# Patient Record
Sex: Male | Born: 1980 | Race: Black or African American | Hispanic: No | Marital: Married | Smoking: Former smoker
Health system: Southern US, Community
[De-identification: ages and names within clinical notes are randomized; demographics above are authoritative.]

## PROBLEM LIST (undated history)

## (undated) DIAGNOSIS — E119 Type 2 diabetes mellitus without complications: Secondary | ICD-10-CM

## (undated) DIAGNOSIS — R519 Headache, unspecified: Secondary | ICD-10-CM

## (undated) DIAGNOSIS — K219 Gastro-esophageal reflux disease without esophagitis: Secondary | ICD-10-CM

## (undated) DIAGNOSIS — F419 Anxiety disorder, unspecified: Secondary | ICD-10-CM

## (undated) DIAGNOSIS — R51 Headache: Secondary | ICD-10-CM

## (undated) DIAGNOSIS — M1990 Unspecified osteoarthritis, unspecified site: Secondary | ICD-10-CM

## (undated) DIAGNOSIS — B019 Varicella without complication: Secondary | ICD-10-CM

## (undated) HISTORY — DX: Gastro-esophageal reflux disease without esophagitis: K21.9

## (undated) HISTORY — DX: Headache, unspecified: R51.9

## (undated) HISTORY — DX: Unspecified osteoarthritis, unspecified site: M19.90

## (undated) HISTORY — PX: TONSILLECTOMY AND ADENOIDECTOMY: SUR1326

## (undated) HISTORY — DX: Anxiety disorder, unspecified: F41.9

## (undated) HISTORY — DX: Varicella without complication: B01.9

## (undated) HISTORY — DX: Headache: R51

## (undated) HISTORY — DX: Type 2 diabetes mellitus without complications: E11.9

## (undated) HISTORY — PX: UPPER GI ENDOSCOPY: SHX6162

---

## 2006-03-17 ENCOUNTER — Other Ambulatory Visit: Payer: Self-pay

## 2006-03-17 ENCOUNTER — Emergency Department: Payer: Self-pay | Admitting: Emergency Medicine

## 2007-01-02 ENCOUNTER — Emergency Department: Payer: Self-pay | Admitting: Emergency Medicine

## 2008-03-09 ENCOUNTER — Emergency Department: Payer: Self-pay

## 2008-09-30 ENCOUNTER — Emergency Department: Payer: Self-pay | Admitting: Emergency Medicine

## 2009-10-24 ENCOUNTER — Emergency Department: Payer: Self-pay | Admitting: Emergency Medicine

## 2011-01-31 ENCOUNTER — Emergency Department: Payer: Self-pay | Admitting: Emergency Medicine

## 2011-01-31 LAB — COMPREHENSIVE METABOLIC PANEL WITH GFR
Albumin: 4.4 g/dL
Alkaline Phosphatase: 40 U/L — ABNORMAL LOW
Anion Gap: 8
BUN: 9 mg/dL
Bilirubin,Total: 0.6 mg/dL
Calcium, Total: 9 mg/dL
Chloride: 102 mmol/L
Co2: 31 mmol/L
Creatinine: 0.66 mg/dL
EGFR (African American): >60
EGFR (Non-African Amer.): >60
Glucose: 95 mg/dL
Osmolality: 280
Potassium: 3.4 mmol/L — ABNORMAL LOW
SGOT(AST): 36 U/L
SGPT (ALT): 61 U/L
Sodium: 141 mmol/L
Total Protein: 7.9 g/dL

## 2011-01-31 LAB — CBC
HCT: 38.6 % — ABNORMAL LOW (ref 40.0–52.0)
MCH: 26.9 pg (ref 26.0–34.0)
MCHC: 32.3 g/dL (ref 32.0–36.0)
MCV: 83 fL (ref 80–100)
Platelet: 221 10*3/uL (ref 150–440)
RBC: 4.63 10*6/uL (ref 4.40–5.90)

## 2011-01-31 LAB — TROPONIN I: Troponin-I: <0.02 ng/mL

## 2011-06-02 ENCOUNTER — Ambulatory Visit: Payer: Self-pay | Admitting: Family Medicine

## 2014-10-27 ENCOUNTER — Encounter: Payer: Self-pay | Admitting: Family Medicine

## 2014-10-27 ENCOUNTER — Telehealth: Payer: Self-pay | Admitting: Family Medicine

## 2014-10-27 ENCOUNTER — Ambulatory Visit (INDEPENDENT_AMBULATORY_CARE_PROVIDER_SITE_OTHER): Payer: BLUE CROSS/BLUE SHIELD | Admitting: Family Medicine

## 2014-10-27 VITALS — BP 124/76 | HR 59 | Temp 98.3°F | Ht 65.16 in | Wt 187.4 lb

## 2014-10-27 DIAGNOSIS — R109 Unspecified abdominal pain: Secondary | ICD-10-CM | POA: Insufficient documentation

## 2014-10-27 DIAGNOSIS — R200 Anesthesia of skin: Secondary | ICD-10-CM | POA: Diagnosis not present

## 2014-10-27 DIAGNOSIS — R079 Chest pain, unspecified: Secondary | ICD-10-CM

## 2014-10-27 DIAGNOSIS — R1084 Generalized abdominal pain: Secondary | ICD-10-CM

## 2014-10-27 DIAGNOSIS — G44209 Tension-type headache, unspecified, not intractable: Secondary | ICD-10-CM

## 2014-10-27 DIAGNOSIS — H539 Unspecified visual disturbance: Secondary | ICD-10-CM

## 2014-10-27 DIAGNOSIS — R519 Headache, unspecified: Secondary | ICD-10-CM | POA: Insufficient documentation

## 2014-10-27 DIAGNOSIS — R51 Headache: Secondary | ICD-10-CM

## 2014-10-27 DIAGNOSIS — M545 Low back pain, unspecified: Secondary | ICD-10-CM

## 2014-10-27 LAB — CBC
HCT: 37.9 % — ABNORMAL LOW (ref 39.0–52.0)
Hemoglobin: 12.3 g/dL — ABNORMAL LOW (ref 13.0–17.0)
MCHC: 32.5 g/dL (ref 30.0–36.0)
MCV: 81.4 fl (ref 78.0–100.0)
Platelets: 172 10*3/uL (ref 150.0–400.0)
RBC: 4.66 Mil/uL (ref 4.22–5.81)
RDW: 14.3 % (ref 11.5–15.5)
WBC: 3.4 10*3/uL — ABNORMAL LOW (ref 4.0–10.5)

## 2014-10-27 LAB — LIPID PANEL
CHOLESTEROL: 132 mg/dL (ref 0–200)
HDL: 29.7 mg/dL — ABNORMAL LOW (ref >39.00)
LDL Cholesterol: 88 mg/dL (ref 0–99)
NonHDL: 102.59
Total CHOL/HDL Ratio: 4
Triglycerides: 74 mg/dL (ref 0.0–149.0)
VLDL: 14.8 mg/dL (ref 0.0–40.0)

## 2014-10-27 LAB — COMPREHENSIVE METABOLIC PANEL
ALBUMIN: 4.2 g/dL (ref 3.5–5.2)
ALK PHOS: 34 U/L — AB (ref 39–117)
ALT: 24 U/L (ref 0–53)
AST: 21 U/L (ref 0–37)
BUN: 8 mg/dL (ref 6–23)
CHLORIDE: 104 meq/L (ref 96–112)
CO2: 31 mEq/L (ref 19–32)
CREATININE: 0.7 mg/dL (ref 0.40–1.50)
Calcium: 8.8 mg/dL (ref 8.4–10.5)
GFR: 164.24 mL/min (ref >60.00)
Glucose, Bld: 95 mg/dL (ref 70–99)
Potassium: 4.2 mEq/L (ref 3.5–5.1)
SODIUM: 142 meq/L (ref 135–145)
TOTAL PROTEIN: 6.9 g/dL (ref 6.0–8.3)
Total Bilirubin: 0.4 mg/dL (ref 0.2–1.2)

## 2014-10-27 LAB — HEMOGLOBIN A1C: HEMOGLOBIN A1C: 6.3 % (ref 4.6–6.5)

## 2014-10-27 LAB — TSH: TSH: 0.67 u[IU]/mL (ref 0.35–4.50)

## 2014-10-27 MED ORDER — OMEPRAZOLE 20 MG PO CPDR: 20.0000 mg | DELAYED_RELEASE_CAPSULE | Freq: Every day | ORAL | Status: DC

## 2014-10-27 NOTE — Assessment & Plan Note (Signed)
Seems to be tension type headaches given description. Patient has other symptoms with numbness and vision changes that do not sound as though they are associated with the headaches in particular, though one would wonder if there is some unifying cause for this. He is neurologically intact. Vision is similar in both eyes with slight difference in the right eye. No complaints of these issues today. Given constellation of symptoms will obtain MRI brain to evaluate further. Will check lumbar spine and cervical spine films to evaluate bony structures given locations of numbness. Will refer to optho and neuro for evaluation. Continue advil for tension headaches as this has proven beneficial. Given return precautions.

## 2014-10-27 NOTE — Assessment & Plan Note (Addendum)
Patient with chest pain for a number of years. Seems to be associated with the time period after eating, thus GERD is most likely cause, though would not expect to have dyspnea with GERD thus cardiac cause is possible. EKG reassuring today as it is similar to previous EKG. Unlikely VTE given normal O2 sat and not tachycardic. Normal lung exam makes pulmonary process unlikely. Will start on PPI given GERD is the most likely cause. Will refer to cardiology for further evaluation. Check TSH, CBC, cmet, lipid panel. Given return precautions.

## 2014-10-27 NOTE — Assessment & Plan Note (Signed)
Very nonspecific needle sensation in abdomen diffusely. Benign exam today. Has history of alcohol use so could be liver dysfunction. No urinary complaints. Could be related to diarrhea. Will check CMET today. Will continue to monitor. Given return precautions.

## 2014-10-27 NOTE — Progress Notes (Signed)
Pre visit review using our clinic review tool, if applicable. No additional management support is needed unless otherwise documented below in the visit note. 

## 2014-10-27 NOTE — Telephone Encounter (Signed)
Attempted to call patient to discuss lab results. There was no answer. Asked that he call back to the office. Will await his call.

## 2014-10-27 NOTE — Progress Notes (Signed)
Patient ID: Ryan Wolfe, male   DOB: [birth year], 34 y.o.   MRN: [id number]  Tommi Rumps, MD Phone: [phone]  Ryan Wolfe is a 34 y.o. male who presents today for new patient visit.  Chest pain: notes this occurs mostly at night. Has history of GERD. Notes it is a heavy sensation and is associated with dyspnea. No radiation or diaphoresis. Is central. Has had intermittently for multiple years. No history of DM, HLD, or HTN. No cardaic history in patient. No family history of MI. Eases off on its own. Lasts for one hour at a time. Is not exertional. No history of VTE. No leg swelling. No chest pain or shortness of breath at this time.   Headache: patient notes intermittent frontal headaches that are gradual onset for the past year. Occur most days. Are throbbing. Notes advil resolves the headaches. Notes intermittent left arm and bilateral leg numbness not associated with the headaches. Notes vision intermittently gets milky, though is still able to see. No eye pain or redness with this. No neck pain or photophobia or phonophobia. Notes no symptoms at this time.  Abdominal pain: notes intermittent diffuse sensation of needles in his abdomen. No focal pain. No aching pain. Notes some diarrhea each morning though otherwise normal stools. No N/V. No blood in stool. Has had blood in stool in past about 5 years ago in [place] and had colonoscopy at that time. States he was told this was related to his alcohol use. Notes now only drinks on the weekends a few beers at a time. This discomfort goes away on its own. Had prescription for reflux that helped with this. No pain at this time.   Low back pain: intermittent for the past year. Notes exacerbated if he bends or extends his back in an odd manner. Notes intermittent numbness in his legs that is not noted to occur with the back pain. The numbness does not occur in both legs at the same time. No weakness. No fever, saddle anesthesia, incontinence, or  history of cancer. Mild discomfort at this time in low back.  Active Ambulatory Problems    Diagnosis Date Noted  . Chest pain 10/27/2014  . Abdominal pain 10/27/2014  . Headache 10/27/2014  . Low back pain 10/27/2014   Resolved Ambulatory Problems    Diagnosis Date Noted  . No Resolved Ambulatory Problems   Past Medical History  Diagnosis Date  . Arthritis   . Chicken pox   . GERD (gastroesophageal reflux disease)     Family History  Problem Relation Age of Onset  . Arthritis      grandparent  . Hypertension      parent and grandparent  . Diabetes Mother     Social History   Social History  . Marital Status: Married    Spouse Name: N/A  . Number of Children: N/A  . Years of Education: N/A   Occupational History  . Not on file.   Social History Main Topics  . Smoking status: Current Some Day Smoker  . Smokeless tobacco: Not on file  . Alcohol Use: 3.0 oz/week    5 Standard drinks or equivalent per week  . Drug Use: No  . Sexual Activity: Not on file   Other Topics Concern  . Not on file   Social History Narrative  . No narrative on file    ROS   General:  Negative for nexplained weight loss, fever Skin: Negative for new or changing mole, sore that won't  heal HEENT: positive for vision changes, hoarseness, Negative for trouble hearing, ringing in ears, mouth sores, change in voice, dysphagia. CV:  Positive for chest pain and shortness of breath, Negative for edema, palpitations Resp: Negative for cough, dyspnea, hemoptysis GI: positive for abdominal pain and diarrhea, Negative for nausea, vomiting, constipation, melena, hematochezia. GU: Negative for dysuria, incontinence, urinary hesitance, hematuria, vaginal or penile discharge, polyuria, sexual difficulty, lumps in testicle or breasts MSK: Negative for muscle cramps or aches, joint pain or swelling Neuro: positive for headaches, weakness, numbness, Negative for dizziness, passing out/fainting Psych:  Negative for depression, anxiety, memory problems  Objective  Physical Exam Filed Vitals:   10/27/14 0844  BP: 124/76  Pulse: 59  Temp: 98.3 F (36.8 C)   Physical Exam  Constitutional: He is well-developed, well-nourished, and in no distress.  HENT:  Head: Normocephalic and atraumatic.  Right Ear: External ear normal.  Left Ear: External ear normal.  Mouth/Throat: Oropharynx is clear and moist. No oropharyngeal exudate.  Normal TM bilaterally  Eyes: Conjunctivae are normal. Pupils are equal, round, and reactive to light.  Normal appearing cornea and sclera on inspection, attempted fundus exam, pupillary constriction on exam made difficult to appreciate the fundus  Neck: Neck supple.  Cardiovascular: Normal rate, regular rhythm and normal heart sounds.  Exam reveals no gallop and no friction rub.   No murmur heard. Pulmonary/Chest: Effort normal and breath sounds normal. No respiratory distress. He has no wheezes. He has no rales.  Abdominal: Soft. Bowel sounds are normal. He exhibits no distension and no mass. There is no tenderness. There is no rebound and no guarding.  Musculoskeletal: He exhibits no edema.  Lymphadenopathy:    He has no cervical adenopathy.  Neurological: He is alert.  CN 2-12 intact, 5/5 strength in bilateral biceps, triceps, grip, quads, hamstrings, plantar and dorsiflexion, sensation to light touch intact in bilateral UE and LE, normal gait, 2+ patellar reflexes  Skin: Skin is warm and dry. He is not diaphoretic.  Psychiatric: Mood and affect normal.   EKG: sinus bradycardia with rate variation, rate 54, non-specific T-wave change in V3, rate slower than prior EKG otherwise no significant changes  Assessment/Plan:   Chest pain Patient with chest pain for a number of years. Seems to be associated with the time period after eating, thus GERD is most likely cause, though would not expect to have dyspnea with GERD thus cardiac cause is possible. EKG  reassuring today as it is similar to previous EKG. Unlikely VTE given normal O2 sat and not tachycardic. Normal lung exam makes pulmonary process unlikely. Will start on PPI given GERD is the most likely cause. Will refer to cardiology for further evaluation. Check TSH, CBC, cmet, lipid panel. Given return precautions.   Abdominal pain Very nonspecific needle sensation in abdomen diffusely. Benign exam today. Has history of alcohol use so could be liver dysfunction. No urinary complaints. Could be related to diarrhea. Will check CMET today. Will continue to monitor. Given return precautions.   Headache Seems to be tension type headaches given description. Patient has other symptoms with numbness and vision changes that do not sound as though they are associated with the headaches in particular, though one would wonder if there is some unifying cause for this. He is neurologically intact. Vision is similar in both eyes with slight difference in the right eye. No complaints of these issues today. Given constellation of symptoms will obtain MRI brain to evaluate further. Will check lumbar spine and cervical  spine films to evaluate bony structures given locations of numbness. Will refer to optho and neuro for evaluation. Continue advil for tension headaches as this has proven beneficial. Given return precautions.   Low back pain Chronic issue over the past year. Some numbness, though unsure if this is associated with the back pain. Neuro intact. No red flags. Benign exam today. Will check lumbar spine XR. Advil OTC prn. Given return precautions.     Orders Placed This Encounter  Procedures  . DG Lumbar Spine Complete    Standing Status: Future     Number of Occurrences:      Standing Expiration Date: 12/27/2015    Order Specific Question:  Reason for Exam (SYMPTOM  OR DIAGNOSIS REQUIRED)    Answer:  low back pain    Order Specific Question:  Preferred imaging location?    Answer:  [place] Cervical Spine Complete    Standing Status: Future     Number of Occurrences:      Standing Expiration Date: 12/27/2015    Order Specific Question:  Reason for Exam (SYMPTOM  OR DIAGNOSIS REQUIRED)    Answer:  left arm numbness intermittently    Order Specific Question:  Preferred imaging location?    Answer:  [facility]  . MR Brain Wo Contrast    Standing Status: Future     Number of Occurrences:      Standing Expiration Date: 12/27/2015    Order Specific Question:  Reason for Exam (SYMPTOM  OR DIAGNOSIS REQUIRED)    Answer:  left arm numbness intermittently    Order Specific Question:  Preferred imaging location?    Answer:  [facility]    Order Specific Question:  Does the patient have a pacemaker or implanted devices?    Answer:  No    Order Specific Question:  What is the patient's sedation requirement?    Answer:  No Sedation  . CBC  . TSH  . Lipid Profile  . Comp Met (CMET)  . HgB A1c  . Ambulatory referral to Cardiology    Referral Priority:  Routine    Referral Type:  Consultation    Referral Reason:  Specialty Services Required    Requested Specialty:  Cardiology    Number of Visits Requested:  1  . Ambulatory referral to Neurology    Referral Priority:  Routine    Referral Type:  Consultation    Referral Reason:  Specialty Services Required    Requested Specialty:  Neurology    Number of Visits Requested:  1  . Ambulatory referral to Ophthalmology    Referral Priority:  Routine    Referral Type:  Consultation    Referral Reason:  Specialty Services Required    Requested Specialty:  Ophthalmology    Number of Visits Requested:  1  . EKG 12-Lead    Meds ordered this encounter  Medications  . omeprazole (PRILOSEC) 20 MG capsule    Sig: Take 1 capsule (20 mg total) by mouth daily.    Dispense:  30 capsule    Refill:  [place]

## 2014-10-27 NOTE — Assessment & Plan Note (Signed)
Chronic issue over the past year. Some numbness, though unsure if this is associated with the back pain. Neuro intact. No red flags. Benign exam today. Will check lumbar spine XR. Advil OTC prn. Given return precautions.

## 2014-10-27 NOTE — Patient Instructions (Signed)
Nice to meet you. We will obtain imaging and lab work to evaluate your issues further.  If you develop numbness, weakness, incontinence, fever, abdominal pain, nausea, vomiting, diarrhea, chest pain, shortness of breath, headache, vision changes, worsening pain, or new or a change in symptoms.

## 2014-10-30 ENCOUNTER — Ambulatory Visit (INDEPENDENT_AMBULATORY_CARE_PROVIDER_SITE_OTHER)
Admission: RE | Admit: 2014-10-30 | Discharge: 2014-10-30 | Disposition: A | Discharge status: Home / Self Care | Payer: BLUE CROSS/BLUE SHIELD | Source: Ambulatory Visit | Attending: Family Medicine | Admitting: Family Medicine

## 2014-10-30 ENCOUNTER — Encounter: Payer: Self-pay | Admitting: Family Medicine

## 2014-10-30 DIAGNOSIS — R200 Anesthesia of skin: Secondary | ICD-10-CM | POA: Diagnosis not present

## 2014-10-30 DIAGNOSIS — M545 Low back pain, unspecified: Secondary | ICD-10-CM

## 2014-10-30 NOTE — Telephone Encounter (Signed)
Letter mailed

## 2014-10-30 NOTE — Telephone Encounter (Signed)
Attempted to call patient again. No answer and left voicemail asking him to call back to the office.   Given 2 phone calls to patient will send letter with results asking patient to call back to the office to discuss the results.

## 2014-11-02 ENCOUNTER — Telehealth: Payer: Self-pay | Admitting: *Deleted

## 2014-11-02 NOTE — Telephone Encounter (Signed)
Patient has requested a a follow up on his lab results, a detail message can left on phone number provided.

## 2014-11-03 NOTE — Telephone Encounter (Signed)
Called patient back. There was no answer. Left a detailed message on his voicemail discussing his results. Advised of x-ray results with no bony abnormalities and with apparent spasm in his neck. Advised of CBC abnormalities and that he needs to come in to have these repeated. Advised of other acceptable results. Prior note states he is having severe neck and back pain and given this I advised in my message that he be evaluated over the weekend for this at an urgent care or [facility]. Advised he could call back with any questions.

## 2014-11-03 NOTE — Telephone Encounter (Signed)
Patient called a second time requesting results on lab and Xray. Patient stated that he has to work today at 1430, and a detail ,message can be left on voicemail. Patient has a preference to talk to someone, if possible. Patient stated that he's having severe neck and back pain.

## 2014-11-03 NOTE — Telephone Encounter (Signed)
Patient wanting lab results. 

## 2014-11-06 ENCOUNTER — Telehealth: Payer: Self-pay | Admitting: *Deleted

## 2014-11-06 NOTE — Telephone Encounter (Signed)
Patient called in reference to a voicemail, left by Dr. Birdie Sons, patient wanted to know if he should have a lab visit or a office visit. Patient stated that he did not go to urgent care over the weekend.

## 2014-11-07 NOTE — Telephone Encounter (Signed)
Noted. Thanks.

## 2014-11-07 NOTE — Telephone Encounter (Signed)
Patient coming in for visit on 11/08/14

## 2014-11-08 ENCOUNTER — Ambulatory Visit (INDEPENDENT_AMBULATORY_CARE_PROVIDER_SITE_OTHER): Payer: BLUE CROSS/BLUE SHIELD | Admitting: Family Medicine

## 2014-11-08 ENCOUNTER — Encounter: Payer: Self-pay | Admitting: Family Medicine

## 2014-11-08 VITALS — BP 112/66 | HR 68 | Temp 98.6°F | Ht 65.16 in | Wt 186.6 lb

## 2014-11-08 DIAGNOSIS — D72819 Decreased white blood cell count, unspecified: Secondary | ICD-10-CM | POA: Diagnosis not present

## 2014-11-08 DIAGNOSIS — M545 Low back pain, unspecified: Secondary | ICD-10-CM

## 2014-11-08 DIAGNOSIS — E119 Type 2 diabetes mellitus without complications: Secondary | ICD-10-CM | POA: Insufficient documentation

## 2014-11-08 DIAGNOSIS — G44209 Tension-type headache, unspecified, not intractable: Secondary | ICD-10-CM

## 2014-11-08 DIAGNOSIS — M542 Cervicalgia: Secondary | ICD-10-CM

## 2014-11-08 DIAGNOSIS — R7303 Prediabetes: Secondary | ICD-10-CM

## 2014-11-08 LAB — CBC WITH DIFFERENTIAL/PLATELET
BASOS ABS: 0 10*3/uL (ref 0.0–0.1)
Basophils Relative: 0.2 % (ref 0.0–3.0)
Eosinophils Absolute: 0.3 10*3/uL (ref 0.0–0.7)
Eosinophils Relative: 4.5 % (ref 0.0–5.0)
HCT: 37.8 % — ABNORMAL LOW (ref 39.0–52.0)
Hemoglobin: 12.5 g/dL — ABNORMAL LOW (ref 13.0–17.0)
LYMPHS ABS: 3.2 10*3/uL (ref 0.7–4.0)
LYMPHS PCT: 54.1 % — AB (ref 12.0–46.0)
MCHC: 33 g/dL (ref 30.0–36.0)
MCV: 79.6 fl (ref 78.0–100.0)
MONOS PCT: 10.5 % (ref 3.0–12.0)
Monocytes Absolute: 0.6 10*3/uL (ref 0.1–1.0)
NEUTROS PCT: 30.7 % — AB (ref 43.0–77.0)
Neutro Abs: 1.8 10*3/uL (ref 1.4–7.7)
Platelets: 213 10*3/uL (ref 150.0–400.0)
RBC: 4.75 Mil/uL (ref 4.22–5.81)
RDW: 14 % (ref 11.5–15.5)
WBC: 5.9 10*3/uL (ref 4.0–10.5)

## 2014-11-08 MED ORDER — CYCLOBENZAPRINE HCL 10 MG PO TABS: 10.0000 mg | ORAL_TABLET | Freq: Three times a day (TID) | ORAL | Status: DC | PRN

## 2014-11-08 NOTE — Progress Notes (Signed)
Pre visit review using our clinic review tool, if applicable. No additional management support is needed unless otherwise documented below in the visit note. 

## 2014-11-08 NOTE — Assessment & Plan Note (Signed)
Pain consistent with right trapezius spasm, likely contributing to headache last week. Improved with muscle relaxer. Spasm noted on cervical spine XR. Feels much improved with no headache or neck pain at this time. Neuro intact. Will treat with flexeril prn. Given return precautions.

## 2014-11-08 NOTE — Assessment & Plan Note (Signed)
Chronic issue with acute exacerbation last week. Reassuring lumbar films. Neuro intact. Pain improved today. Benign exam. Will give flexeril for prn use as this proved beneficial. Given return precautions.

## 2014-11-08 NOTE — Progress Notes (Signed)
Patient ID: Ryan Wolfe, male   DOB: 31-Aug-1980, 34 y.o.   MRN: [id number]  [name], MD Phone: [phone]  Ryan Wolfe is a 34 y.o. male who presents today for f/u.  Headache: had headache last week. Notes it was frontal in nature and aching and started after he developed pain and spasm in his right trapezius. Notes he had full ROM of neck. These were gradual onset symptoms. Massaging his neck helped. He took one of his brothers muscle relaxers and this resolved the discomfort. Has also been taking advil as needed. No numbness, weakness, or vision changes with this. No headache or neck pain at this time. He has a history of intermittent scattered numbness, though has not had any since last seeing me. He has a neurology appointment scheduled for later this month.   Low back pain: bilateral. Twinge and sharp pain with bending. Sitting up straight hurts as well. No specific injury, though does do a lot of lifting at work. Lumbar spine XR with no acute changes. No numbness, weakness, fevers, incontinence, saddle anesthesia, or history of cancer. Notes the muscle relaxer helped significantly. Minimal low back pain at this time.   Pre-diabetes: A1c of 6.3 at last visit. Notes some polyuria with this. No polydipsia. No other urinary symptoms. Mom has DM. Has been changing his diet. Decreased meat intake. Has stopped eating bread recently. No more soda intake. Eating salads at night, though is using a fair amount of dressing.   PMH: smoker.    ROS see HPI  Objective  Physical Exam Filed Vitals:   11/08/14 0817  BP: 112/66  Pulse: 68  Temp: 98.6 F (37 C)    Physical Exam  Constitutional: He is well-developed, well-nourished, and in no distress.  HENT:  Head: Normocephalic and atraumatic.  Right Ear: External ear normal.  Left Ear: External ear normal.  Mouth/Throat: Oropharynx is clear and moist. No oropharyngeal exudate.  Eyes: Conjunctivae are normal. Pupils are equal, round,  and reactive to light.  Neck: Normal range of motion. Neck supple.  Cardiovascular: Normal rate, regular rhythm and normal heart sounds.  Exam reveals no gallop and no friction rub.   No murmur heard. Pulmonary/Chest: Effort normal and breath sounds normal. No respiratory distress. He has no wheezes. He has no rales.  Musculoskeletal:  No midline spine tenderness, no muscular tenderness or spasm in his neck, there is no tenderness in muscular portion of his low back, no swelling in back   Lymphadenopathy:    He has no cervical adenopathy.  Neurological: He is alert.  CN 2-12 intact, 5/5 strength in bilateral biceps, triceps, grip, quads, hamstrings, plantar and dorsiflexion, sensation to light touch intact in bilateral UE and LE, normal gait, 2+ patellar reflexes  Skin: Skin is warm and dry. He is not diaphoretic.     Assessment/Plan: Please see individual problem list.  Headache Stable from last visit. No HA at this time. Neuro intact. Could be tension headaches given history of spasm in neck. Will trial flexeril for this and continue prn advil use. Patient will keep appointment with neurology for evaluation of headaches and scattered intermittent numbness. Attempted to order MRI at last visit of brain, though patients insurance company would not approve this for headache or numbness diagnoses stating patient needed 4 weeks of treatment by a physician. Will have neurology evaluate the patient and determine the need for imaging. Given return precautions.   Low back pain Chronic issue with acute exacerbation last week. Reassuring lumbar films.  Neuro intact. Pain improved today. Benign exam. Will give flexeril for prn use as this proved beneficial. Given return precautions.   Neck pain Pain consistent with right trapezius spasm, likely contributing to headache last week. Improved with muscle relaxer. Spasm noted on cervical spine XR. Feels much improved with no headache or neck pain at this  time. Neuro intact. Will treat with flexeril prn. Given return precautions.   Prediabetes A1c 6.3 at last visit. Discussed this with patient. Advised on diet. Advised to await cardiology appointment on Friday (reports no persistent chest pain symptoms) prior to increasing exercise. Discussed medication, though patient opted for diet changes. Will continue to monitor. Plan for A1c in 3 months.     Orders Placed This Encounter  Procedures  . CBC w/Diff    Meds ordered this encounter  Medications  . cyclobenzaprine (FLEXERIL) 10 MG tablet    Sig: Take 1 tablet (10 mg total) by mouth 3 (three) times daily as needed for muscle spasms.    Dispense:  30 tablet    Refill:  0   Marikay AlarEric Vallie Fayette

## 2014-11-08 NOTE — Patient Instructions (Signed)
Nice to see you. Your neck pain and back pain are likely due to muscle strain or spasm. We will treat this with a flexeril, a muscle relaxer.  Please work on dietary changes to help with your pre-diabetes. Please do not increase your activity level until you see the cardiologist. Please keep the appointment with the neurologist as well.  If you develop headache, numbness, weakness, vision changes, nausea, vomiting, fevers, back pain, loss of bowel or bladder function, or numbness between your legs please seek medical attention.

## 2014-11-08 NOTE — Assessment & Plan Note (Signed)
A1c 6.3 at last visit. Discussed this with patient. Advised on diet. Advised to await cardiology appointment on Friday (reports no persistent chest pain symptoms) prior to increasing exercise. Discussed medication, though patient opted for diet changes. Will continue to monitor. Plan for A1c in 3 months.

## 2014-11-08 NOTE — Assessment & Plan Note (Addendum)
Stable from last visit. No HA at this time. Neuro intact. Could be tension headaches given history of spasm in neck. Will trial flexeril for this and continue prn advil use. Patient will keep appointment with neurology for evaluation of headaches and scattered intermittent numbness. Attempted to order MRI at last visit of brain, though patients insurance company would not approve this for headache or numbness diagnoses stating patient needed 4 weeks of treatment by a physician. Will have neurology evaluate the patient and determine the need for imaging. Given return precautions.

## 2014-11-10 ENCOUNTER — Encounter: Payer: Self-pay | Admitting: Family Medicine

## 2014-11-15 ENCOUNTER — Encounter: Payer: Self-pay | Admitting: Surgical

## 2014-11-16 LAB — HM DIABETES EYE EXAM

## 2014-11-17 ENCOUNTER — Encounter: Payer: Self-pay | Admitting: Surgical

## 2014-11-28 ENCOUNTER — Other Ambulatory Visit: Payer: BLUE CROSS/BLUE SHIELD

## 2014-11-30 ENCOUNTER — Other Ambulatory Visit (INDEPENDENT_AMBULATORY_CARE_PROVIDER_SITE_OTHER): Payer: BLUE CROSS/BLUE SHIELD

## 2014-11-30 ENCOUNTER — Telehealth: Payer: Self-pay | Admitting: *Deleted

## 2014-11-30 DIAGNOSIS — D649 Anemia, unspecified: Secondary | ICD-10-CM

## 2014-11-30 LAB — FERRITIN: Ferritin: 186.5 ng/mL (ref 22.0–322.0)

## 2014-11-30 NOTE — Telephone Encounter (Signed)
Orders placed.

## 2014-11-30 NOTE — Telephone Encounter (Signed)
Labs and dx?  

## 2014-12-01 LAB — IRON AND TIBC
%SAT: 29 % (ref 15–60)
Iron: 74 ug/dL (ref 50–180)
TIBC: 255 ug/dL (ref 250–425)
UIBC: 181 ug/dL (ref 125–400)

## 2014-12-01 LAB — PATHOLOGIST SMEAR REVIEW

## 2014-12-07 ENCOUNTER — Encounter: Payer: Self-pay | Admitting: Family Medicine

## 2015-08-28 ENCOUNTER — Encounter: Payer: Self-pay | Admitting: Emergency Medicine

## 2015-08-28 ENCOUNTER — Emergency Department
Admission: EM | Admit: 2015-08-28 | Discharge: 2015-08-28 | Disposition: A | Discharge status: Home / Self Care | Payer: BLUE CROSS/BLUE SHIELD | Attending: Emergency Medicine | Admitting: Emergency Medicine

## 2015-08-28 DIAGNOSIS — F17200 Nicotine dependence, unspecified, uncomplicated: Secondary | ICD-10-CM | POA: Diagnosis not present

## 2015-08-28 DIAGNOSIS — L0100 Impetigo, unspecified: Secondary | ICD-10-CM | POA: Diagnosis not present

## 2015-08-28 DIAGNOSIS — R21 Rash and other nonspecific skin eruption: Secondary | ICD-10-CM | POA: Diagnosis present

## 2015-08-28 MED ORDER — AMOXICILLIN-POT CLAVULANATE 875-125 MG PO TABS
1.0000 | ORAL_TABLET | Freq: Once | ORAL | Status: AC
  Administered 2015-08-28: 1 via ORAL

## 2015-08-28 MED ORDER — VALACYCLOVIR HCL 500 MG PO TABS
ORAL_TABLET | ORAL | Status: AC
  Administered 2015-08-28: 1000 mg via ORAL
  Filled 2015-08-28: qty 2

## 2015-08-28 MED ORDER — VALACYCLOVIR HCL 500 MG PO TABS
1000.0000 mg | ORAL_TABLET | Freq: Once | ORAL | Status: AC
  Administered 2015-08-28: 1000 mg via ORAL

## 2015-08-28 MED ORDER — AMOXICILLIN-POT CLAVULANATE 875-125 MG PO TABS
ORAL_TABLET | ORAL | Status: AC
  Administered 2015-08-28: 1 via ORAL
  Filled 2015-08-28: qty 1

## 2015-08-28 MED ORDER — AMOXICILLIN-POT CLAVULANATE 875-125 MG PO TABS: 1.0000 | ORAL_TABLET | Freq: Two times a day (BID) | ORAL | 0 refills | Status: AC

## 2015-08-28 NOTE — ED Provider Notes (Signed)
[facility] Emergency Department Provider Note  ____________________________________________   None    (approximate)  I have reviewed the triage vital signs and the nursing notes.   HISTORY  Chief Complaint Rash   HPI Ryan Wolfe is a 35 y.o. male presents with rash to pruritic and subsequent painful left axilla 4 days. Patient states symptoms worsen after applying cortisone cream to the area. Patient denies any fever afebrile on presentation with temperature 90.8   Past Medical History:  Diagnosis Date  . Arthritis   . Chicken pox   . GERD (gastroesophageal reflux disease)   . Headache     Patient Active Problem List   Diagnosis Date Noted  . Neck pain 11/08/2014  . Prediabetes 11/08/2014  . Chest pain 10/27/2014  . Abdominal pain 10/27/2014  . Headache 10/27/2014  . Low back pain 10/27/2014    Past Surgical History:  Procedure Laterality Date  . TONSILLECTOMY AND ADENOIDECTOMY      Prior to Admission medications   Medication Sig Start Date End Date Taking? Authorizing Provider  amoxicillin-clavulanate (AUGMENTIN) 875-125 MG tablet Take 1 tablet by mouth 2 (two) times daily. 08/28/15 09/11/15  Darci Current, MD  cyclobenzaprine (FLEXERIL) 10 MG tablet Take 1 tablet (10 mg total) by mouth 3 (three) times daily as needed for muscle spasms. 11/08/14   Glori Luis, MD  omeprazole (PRILOSEC) 20 MG capsule Take 1 capsule (20 mg total) by mouth daily. 10/27/14   Glori Luis, MD    Allergies No known drug allergies  Family History  Problem Relation Age of Onset  . Arthritis      grandparent  . Hypertension      parent and grandparent  . Diabetes Mother     Social History Social History  Substance Use Topics  . Smoking status: Current Some Day Smoker  . Smokeless tobacco: Never Used  . Alcohol use 3.0 oz/week    5 Standard drinks or equivalent per week    Review of Systems Constitutional: No fever/chills Eyes: No  visual changes. ENT: No sore throat. Cardiovascular: Denies chest pain. Respiratory: Denies shortness of breath. Gastrointestinal: No abdominal pain.  No nausea, no vomiting.  No diarrhea.  No constipation. Genitourinary: Negative for dysuria. Musculoskeletal: Negative for back pain. Skin: Positive for rash. Neurological: Negative for headaches, focal weakness or numbness.  10-point ROS otherwise negative.  ____________________________________________   PHYSICAL EXAM:  VITAL SIGNS: ED Triage Vitals [08/28/15 0045]  Enc Vitals Group     BP 138/89     Pulse Rate (!) 54     Resp 18     Temp 98 F (36.7 C)     Temp Source Oral     SpO2 100 %     Weight 188 lb (85.3 kg)     Height  (1.676 m)     Head Circumference      Peak Flow      Pain Score      Pain Loc      Pain Edu?      Excl. in GC?     Constitutional: Alert and oriented. Well appearing and in no acute distress. Eyes: Conjunctivae are normal. PERRL. EOMI. Head: Atraumatic. Ears:  Healthy appearing ear canals and TMs bilaterally Nose: No congestion/rhinnorhea. Mouth/Throat: Mucous membranes are moist.  Oropharynx non-erythematous. Neck: No stridor.  No meningeal signs.  Cardiovascular: Normal rate, regular rhythm. Good peripheral circulation. Grossly normal heart sounds.   Respiratory: Normal respiratory effort.  No retractions. Lungs CTAB. Gastrointestinal: Soft and nontender. No distention.  Genitourinary:  Musculoskeletal: No lower extremity tenderness nor edema. No gross deformities of extremities. Neurologic:  Normal speech and language. No gross focal neurologic deficits are appreciated.  Skin:  Erythematous rash plaque formation left axilla. Pustules also noted   ____________________________________________   LABS (all labs ordered are listed, but only abnormal results are displayed)  Labs Reviewed - No data to  display ____________________________________________    PROCEDURES  Procedure(s) performed:   Procedures   ____________________________________________   INITIAL IMPRESSION / ASSESSMENT AND PLAN / ED COURSE  Pertinent labs & imaging results that were available during my care of the patient were reviewed by me and considered in my medical decision making (see chart for details).  Rash consistent with possible impetigo as such Augmentin 875 mg given will be prescribed at home considered a possibility of shingles however rash is not following dermatomal distribution.  Clinical Course    ____________________________________________  FINAL CLINICAL IMPRESSION(S) / ED DIAGNOSES  Final diagnoses:  Impetigo     MEDICATIONS GIVEN DURING THIS VISIT:  Medications  valACYclovir (VALTREX) tablet 1,000 mg (not administered)  amoxicillin-clavulanate (AUGMENTIN) 875-125 MG per tablet 1 tablet (not administered)     NEW OUTPATIENT MEDICATIONS STARTED DURING THIS VISIT:  New Prescriptions   AMOXICILLIN-CLAVULANATE (AUGMENTIN) 875-125 MG TABLET    Take 1 tablet by mouth 2 (two) times daily.      Note:  This document was prepared using Dragon voice recognition software and may include unintentional dictation errors.    Darci Current, MD 08/28/15 [phone]

## 2015-08-28 NOTE — ED Triage Notes (Signed)
Patient ambulatory to triage with steady gait, without difficulty or distress noted; pt reports rash to left axillae since Friday; st began as a bump and spread; area noted to axillae with few scattered vesicles down left side; pt reports has been using cortisone cream; Dr Manson Passey to triage to assess pt

## 2016-04-09 ENCOUNTER — Emergency Department
Admission: EM | Admit: 2016-04-09 | Discharge: 2016-04-09 | Disposition: A | Discharge status: Home / Self Care | Payer: BLUE CROSS/BLUE SHIELD | Attending: Emergency Medicine | Admitting: Emergency Medicine

## 2016-04-09 DIAGNOSIS — Z79899 Other long term (current) drug therapy: Secondary | ICD-10-CM | POA: Diagnosis not present

## 2016-04-09 DIAGNOSIS — J111 Influenza due to unidentified influenza virus with other respiratory manifestations: Secondary | ICD-10-CM

## 2016-04-09 DIAGNOSIS — B349 Viral infection, unspecified: Secondary | ICD-10-CM | POA: Diagnosis not present

## 2016-04-09 DIAGNOSIS — R0981 Nasal congestion: Secondary | ICD-10-CM | POA: Diagnosis present

## 2016-04-09 DIAGNOSIS — F17200 Nicotine dependence, unspecified, uncomplicated: Secondary | ICD-10-CM | POA: Insufficient documentation

## 2016-04-09 DIAGNOSIS — R69 Illness, unspecified: Secondary | ICD-10-CM

## 2016-04-09 MED ORDER — IBUPROFEN 600 MG PO TABS
600.0000 mg | ORAL_TABLET | Freq: Three times a day (TID) | ORAL | 0 refills | Status: DC | PRN
Start: 2016-04-09 — End: 2017-10-14

## 2016-04-09 MED ORDER — FIRST-DUKES MOUTHWASH MT SUSP: 10.0000 mL | Freq: Four times a day (QID) | OROMUCOSAL | 0 refills | Status: DC

## 2016-04-09 MED ORDER — LIDOCAINE VISCOUS 2 % MT SOLN: 5.0000 mL | Freq: Four times a day (QID) | OROMUCOSAL | 0 refills | Status: DC | PRN

## 2016-04-09 MED ORDER — PSEUDOEPH-BROMPHEN-DM 30-2-10 MG/5ML PO SYRP: 5.0000 mL | ORAL_SOLUTION | Freq: Four times a day (QID) | ORAL | 0 refills | Status: DC | PRN

## 2016-04-09 NOTE — ED Provider Notes (Signed)
[facility] Emergency Department Provider Note   ____________________________________________   First MD Initiated Contact with Patient 04/09/16 [phone]     (approximate)  I have reviewed the triage vital signs and the nursing notes.   HISTORY  Chief Complaint Influenza and Sore Throat    HPI Ryan Wolfe is a 36 y.o. male patient complaining of flulike symptoms consisting of nasal congestion intermittently runny nose. Patient also has sore throat, body aches, and nonproductive cough. Patient states transient relief with over-the-counter preparations. Patient denies nausea vomiting but had 1 episode of loose stools yesterday. Patient has not taken a flu shot this season.   Past Medical History:  Diagnosis Date  . Arthritis   . Chicken pox   . GERD (gastroesophageal reflux disease)   . Headache     Patient Active Problem List   Diagnosis Date Noted  . Neck pain 11/08/2014  . Prediabetes 11/08/2014  . Chest pain 10/27/2014  . Abdominal pain 10/27/2014  . Headache 10/27/2014  . Low back pain 10/27/2014    Past Surgical History:  Procedure Laterality Date  . TONSILLECTOMY AND ADENOIDECTOMY      Prior to Admission medications   Medication Sig Start Date End Date Taking? Authorizing Provider  brompheniramine-pseudoephedrine-DM 30-2-10 MG/5ML syrup Take 5 mLs by mouth 4 (four) times daily as needed. 04/09/16   Joni Reining, PA-C  cyclobenzaprine (FLEXERIL) 10 MG tablet Take 1 tablet (10 mg total) by mouth 3 (three) times daily as needed for muscle spasms. 11/08/14   Glori Luis, MD  Diphenhyd-Hydrocort-Nystatin (FIRST-DUKES MOUTHWASH) SUSP Use as directed 10 mLs in the mouth or throat 4 (four) times daily. 04/09/16   Joni Reining, PA-C  ibuprofen (ADVIL,MOTRIN) 600 MG tablet Take 1 tablet (600 mg total) by mouth every 8 (eight) hours as needed. 04/09/16   Joni Reining, PA-C  lidocaine (XYLOCAINE) 2 % solution Use as directed 5 mLs in the  mouth or throat every 6 (six) hours as needed for mouth pain. Mouthwashand swallow. 04/09/16   Joni Reining, PA-C  omeprazole (PRILOSEC) 20 MG capsule Take 1 capsule (20 mg total) by mouth daily. 10/27/14   Glori Luis, MD    Allergies Patient has no known allergies.  Family History  Problem Relation Age of Onset  . Arthritis      grandparent  . Hypertension      parent and grandparent  . Diabetes Mother     Social History Social History  Substance Use Topics  . Smoking status: Current Some Day Smoker  . Smokeless tobacco: Never Used  . Alcohol use 3.0 oz/week    5 Standard drinks or equivalent per week    Review of Systems Constitutional: No fever/chills. Body aches Eyes: No visual changes. ENT: Nasal congestion and sore throat Cardiovascular: Denies chest pain. Respiratory: Denies shortness of breath. Gastrointestinal: No abdominal pain.  No nausea, no vomiting.  No diarrhea.  No constipation. Genitourinary: Negative for dysuria. Musculoskeletal: Negative for back pain. Skin: Negative for rash. Neurological: Negative for headaches, focal weakness or numbness.    ____________________________________________   PHYSICAL EXAM:  VITAL SIGNS: ED Triage Vitals  Enc Vitals Group     BP 04/09/16 0835 137/86     Pulse Rate 04/09/16 0835 97     Resp 04/09/16 0835 18     Temp 04/09/16 0835 99.3 F (37.4 C)     Temp Source 04/09/16 0835 Oral     SpO2 04/09/16 0835 97 %  Weight 04/09/16 0835 182 lb (82.6 kg)     Height 04/09/16 0835 5\' 6"  (1.676 m)     Head Circumference --      Peak Flow --      Pain Score 04/09/16 0833 9     Pain Loc --      Pain Edu? --      Excl. in GC? --     Constitutional: Alert and oriented. Well appearing and in no acute distress. Eyes: Conjunctivae are normal. PERRL. EOMI. Head: Atraumatic. Nose:Edematous nasal turbinates clear rhinorrhea. Mouth/Throat: Mucous membranes are moist.  Oropharynx erythematous. Postnasal  drainage Neck: No stridor.  No cervical spine tenderness to palpation. Hematological/Lymphatic/Immunilogical: No cervical lymphadenopathy. Cardiovascular: Normal rate, regular rhythm. Grossly normal heart sounds.  Good peripheral circulation. Respiratory: Normal respiratory effort.  No retractions. Lungs CTAB. Nonproductive cough Gastrointestinal: Soft and nontender. No distention. No abdominal bruits. No CVA tenderness. Musculoskeletal: No lower extremity tenderness nor edema.  No joint effusions. Neurologic:  Normal speech and language. No gross focal neurologic deficits are appreciated. No gait instability. Skin:  Skin is warm, dry and intact. No rash noted. Psychiatric: Mood and affect are normal. Speech and behavior are normal.  ____________________________________________   LABS (all labs ordered are listed, but only abnormal results are displayed)  Labs Reviewed - No data to display ____________________________________________  EKG   ____________________________________________  RADIOLOGY   ____________________________________________   PROCEDURES  Procedure(s) performed: None  Procedures  Critical Care performed: No  ____________________________________________   INITIAL IMPRESSION / ASSESSMENT AND PLAN / ED COURSE  Pertinent labs & imaging results that were available during my care of the patient were reviewed by me and considered in my medical decision making (see chart for details).  Viral illness. Patient given discharge care instructions. Patient given a work note. Patient get a prescription for Bromfed DM, [facility] mouthwash, viscous lidocaine, and ibuprofen.      ____________________________________________   FINAL CLINICAL IMPRESSION(S) / ED DIAGNOSES  Final diagnoses:  Influenza-like illness      NEW MEDICATIONS STARTED DURING THIS VISIT:  New Prescriptions   BROMPHENIRAMINE-PSEUDOEPHEDRINE-DM 30-2-10 MG/5ML SYRUP    Take 5 mLs by mouth 4  (four) times daily as needed.   DIPHENHYD-HYDROCORT-NYSTATIN (FIRST-DUKES MOUTHWASH) SUSP    Use as directed 10 mLs in the mouth or throat 4 (four) times daily.   IBUPROFEN (ADVIL,MOTRIN) 600 MG TABLET    Take 1 tablet (600 mg total) by mouth every 8 (eight) hours as needed.   LIDOCAINE (XYLOCAINE) 2 % SOLUTION    Use as directed 5 mLs in the mouth or throat every 6 (six) hours as needed for mouth pain. Mouthwashand swallow.     Note:  This document was prepared using Dragon voice recognition software and may include unintentional dictation errors.    Joni ReiningRonald K Heinz Eckert, PA-C 04/09/16 [id number]    Jennye MoccasinBrian S Quigley, MD 04/09/16 [phone]

## 2016-04-09 NOTE — ED Notes (Signed)
Flu like sx's since Sunday, fever on Sunday.

## 2016-04-09 NOTE — ED Triage Notes (Signed)
Flu like sx. Has had fever on sunday

## 2016-12-22 ENCOUNTER — Ambulatory Visit: Payer: BLUE CROSS/BLUE SHIELD | Admitting: Internal Medicine

## 2016-12-22 ENCOUNTER — Encounter: Payer: Self-pay | Admitting: Internal Medicine

## 2016-12-22 DIAGNOSIS — K219 Gastro-esophageal reflux disease without esophagitis: Secondary | ICD-10-CM

## 2016-12-22 DIAGNOSIS — J329 Chronic sinusitis, unspecified: Secondary | ICD-10-CM

## 2016-12-22 MED ORDER — OMEPRAZOLE 20 MG PO CPDR: 20.0000 mg | DELAYED_RELEASE_CAPSULE | Freq: Every day | ORAL | 1 refills | Status: DC

## 2016-12-22 MED ORDER — AMOXICILLIN 875 MG PO TABS: 875.0000 mg | ORAL_TABLET | Freq: Two times a day (BID) | ORAL | 0 refills | Status: DC

## 2016-12-22 NOTE — Patient Instructions (Signed)
Saline nasal spray - flush nose at least 2-3x/day  nasacort nasal spray - 2 sprays each nostril one time per day.  Do this in the evening.    Take a probiotic daily while you are on the antibiotics and for two weeks after completing the antibiotics.    Examples of probiotics:  Florastor, culturelle or align  Robitussin twice a day as needed.

## 2016-12-22 NOTE — Progress Notes (Signed)
Patient ID: Ryan PeacockRobert Wolfe, male   DOB: 05/23/1980, 36 y.o.   MRN: [id number]   Subjective:    Patient ID: Ryan Peacockobert Wolfe, male    DOB: 05/23/1980, 36 y.o.   MRN: [id number]  HPI  Patient here as a work in with concerns regarding some increased nasal congestion and sinus pressure as well as increased acid reflux.  Has a history of acid reflux.  Started noticing increased problems recently.  Not taking omeprazole.  Started taking his mother's medication.  Has helped some.  Trying to watch what he eats.  No vomiting.  Also reports increased nasal congestion and sinus pressure.  Has been present for a while, but recently has progressed.  Now with increased pressure and green mucus production.  Increased drainage.  No sore throat.  No chest tightness or increased cough.  No diarrhea.  States has had sinus infections previously and this feels similar to previous infections.     Past Medical History:  Diagnosis Date  . Arthritis   . Chicken pox   . GERD (gastroesophageal reflux disease)   . Headache    Past Surgical History:  Procedure Laterality Date  . TONSILLECTOMY AND ADENOIDECTOMY     Family History  Problem Relation Age of Onset  . Arthritis Unknown        grandparent  . Hypertension Unknown        parent and grandparent  . Diabetes Mother    Social History   Socioeconomic History  . Marital status: Married    Spouse name: None  . Number of children: None  . Years of education: None  . Highest education level: None  Social Needs  . Financial resource strain: None  . Food insecurity - worry: None  . Food insecurity - inability: None  . Transportation needs - medical: None  . Transportation needs - non-medical: None  Occupational History  . None  Tobacco Use  . Smoking status: Current Some Day Smoker  . Smokeless tobacco: Never Used  Substance and Sexual Activity  . Alcohol use: Yes    Alcohol/week: 3.0 oz    Types: 5 Standard drinks or equivalent per week  . Drug use: No    . Sexual activity: None  Other Topics Concern  . None  Social History Narrative  . None    Outpatient Encounter Medications as of 12/22/2016  Medication Sig  . ibuprofen (ADVIL,MOTRIN) 600 MG tablet Take 1 tablet (600 mg total) by mouth every 8 (eight) hours as needed.  Marland Kitchen. omeprazole (PRILOSEC) 20 MG capsule Take 1 capsule (20 mg total) by mouth daily.  . [DISCONTINUED] brompheniramine-pseudoephedrine-DM 30-2-10 MG/5ML syrup Take 5 mLs by mouth 4 (four) times daily as needed.  . [DISCONTINUED] cyclobenzaprine (FLEXERIL) 10 MG tablet Take 1 tablet (10 mg total) by mouth 3 (three) times daily as needed for muscle spasms.  . [DISCONTINUED] Diphenhyd-Hydrocort-Nystatin (FIRST-DUKES MOUTHWASH) SUSP Use as directed 10 mLs in the mouth or throat 4 (four) times daily.  . [DISCONTINUED] lidocaine (XYLOCAINE) 2 % solution Use as directed 5 mLs in the mouth or throat every 6 (six) hours as needed for mouth pain. Mouthwashand swallow.  . [DISCONTINUED] omeprazole (PRILOSEC) 20 MG capsule Take 1 capsule (20 mg total) by mouth daily.  Marland Kitchen. amoxicillin (AMOXIL) 875 MG tablet Take 1 tablet (875 mg total) by mouth 2 (two) times daily.   No facility-administered encounter medications on file as of 12/22/2016.     Review of Systems  Constitutional: Negative for appetite change and unexpected  weight change.  HENT: Positive for congestion, postnasal drip and sinus pressure. Negative for sore throat.   Respiratory: Negative for cough, chest tightness and shortness of breath.   Cardiovascular: Negative for chest pain and leg swelling.  Gastrointestinal: Negative for abdominal pain, diarrhea, nausea and vomiting.       Acid reflux as outlined.   Musculoskeletal: Negative for back pain and myalgias.  Skin: Negative for color change and rash.  Neurological: Negative for dizziness and headaches.       Objective:     Blood pressure rechecked by me:  126/76  Physical Exam  Constitutional: He appears  well-developed and well-nourished. No distress.  HENT:  Mouth/Throat: Oropharynx is clear and moist.  Nares - slightly erythematous turbinates.  Minimal tenderness to palpation over the sinuses.  TMs visualized - clear.    Eyes: Conjunctivae are normal. Right eye exhibits no discharge. Left eye exhibits no discharge.  Neck: Neck supple.  Cardiovascular: Normal rate and regular rhythm.  Pulmonary/Chest: Effort normal and breath sounds normal. No respiratory distress.  Abdominal: Soft. Bowel sounds are normal. There is no tenderness.  Musculoskeletal: He exhibits no edema or tenderness.  Lymphadenopathy:    He has no cervical adenopathy.  Skin: No rash noted. No erythema.  Psychiatric: He has a normal mood and affect. His behavior is normal.    BP 138/76 (BP Location: Left Arm, Patient Position: Sitting, Cuff Size: Normal)   Pulse 68   Temp 98.6 F (37 C) (Oral)   Ht 5\' 6"  (1.676 m)   Wt 190 lb (86.2 kg)   SpO2 98%   BMI 30.67 kg/m  Wt Readings from Last 3 Encounters:  12/22/16 190 lb (86.2 kg)  04/09/16 182 lb (82.6 kg)  08/28/15 188 lb (85.3 kg)     Lab Results  Component Value Date   WBC 5.9 11/08/2014   HGB 12.5 (L) 11/08/2014   HCT 37.8 (L) 11/08/2014   PLT 213.0 11/08/2014   GLUCOSE 95 10/27/2014   CHOL 132 10/27/2014   TRIG 74.0 10/27/2014   HDL 29.70 (L) 10/27/2014   LDLCALC 88 10/27/2014   ALT 24 10/27/2014   AST 21 10/27/2014   NA 142 10/27/2014   K 4.2 10/27/2014   CL 104 10/27/2014   CREATININE 0.70 10/27/2014   BUN 8 10/27/2014   CO2 31 10/27/2014   TSH 0.67 10/27/2014   HGBA1C 6.3 10/27/2014       Assessment & Plan:   Problem List Items Addressed This Visit    GERD (gastroesophageal reflux disease)    Has a history of acid reflux.  Was off omeprazole.  Started taking his mother's medication with some noted improvement.  She had 10mg  capsules.  Will restart omeprazole 20mg  q day.  Discussed diet adjustment.  Schedule f/u.  If persistent issues,  will need GI evaluation for possible EGD.        Relevant Medications   omeprazole (PRILOSEC) 20 MG capsule   Sinusitis    Increased sinus pressure and congestion.  Persistent and progressing.  Appears to be c/w sinus infection.  Treat with amoxicillin.  Probiotic as directed.  Saline nasal spray, nasacort nasal spray and robitussin as directed.  Also treat acid reflux.  Follow.        Relevant Medications   amoxicillin (AMOXIL) 875 MG tablet       Dale DurhamSCOTT, Izic Stfort, MD

## 2016-12-24 ENCOUNTER — Encounter: Payer: Self-pay | Admitting: Internal Medicine

## 2016-12-24 DIAGNOSIS — K219 Gastro-esophageal reflux disease without esophagitis: Secondary | ICD-10-CM | POA: Insufficient documentation

## 2016-12-24 DIAGNOSIS — J329 Chronic sinusitis, unspecified: Secondary | ICD-10-CM | POA: Insufficient documentation

## 2016-12-24 NOTE — Assessment & Plan Note (Signed)
Has a history of acid reflux.  Was off omeprazole.  Started taking his mother's medication with some noted improvement.  She had 10mg  capsules.  Will restart omeprazole 20mg  q day.  Discussed diet adjustment.  Schedule f/u.  If persistent issues, will need GI evaluation for possible EGD.

## 2016-12-24 NOTE — Assessment & Plan Note (Signed)
Increased sinus pressure and congestion.  Persistent and progressing.  Appears to be c/w sinus infection.  Treat with amoxicillin.  Probiotic as directed.  Saline nasal spray, nasacort nasal spray and robitussin as directed.  Also treat acid reflux.  Follow.

## 2017-03-02 ENCOUNTER — Encounter: Payer: BLUE CROSS/BLUE SHIELD | Admitting: Family Medicine

## 2017-03-02 DIAGNOSIS — Z0289 Encounter for other administrative examinations: Secondary | ICD-10-CM

## 2017-03-19 ENCOUNTER — Emergency Department: Payer: BLUE CROSS/BLUE SHIELD

## 2017-03-19 ENCOUNTER — Emergency Department
Admission: EM | Admit: 2017-03-19 | Discharge: 2017-03-19 | Disposition: A | Discharge status: Home / Self Care | Payer: BLUE CROSS/BLUE SHIELD | Attending: Student in an Organized Health Care Education/Training Program | Admitting: Student in an Organized Health Care Education/Training Program

## 2017-03-19 ENCOUNTER — Encounter: Payer: Self-pay | Admitting: Emergency Medicine

## 2017-03-19 DIAGNOSIS — J209 Acute bronchitis, unspecified: Secondary | ICD-10-CM | POA: Insufficient documentation

## 2017-03-19 DIAGNOSIS — F17200 Nicotine dependence, unspecified, uncomplicated: Secondary | ICD-10-CM | POA: Insufficient documentation

## 2017-03-19 DIAGNOSIS — J40 Bronchitis, not specified as acute or chronic: Secondary | ICD-10-CM

## 2017-03-19 DIAGNOSIS — R079 Chest pain, unspecified: Secondary | ICD-10-CM

## 2017-03-19 DIAGNOSIS — Z79899 Other long term (current) drug therapy: Secondary | ICD-10-CM | POA: Diagnosis not present

## 2017-03-19 LAB — CBC
HEMATOCRIT: 39.6 % — AB (ref 40.0–52.0)
HEMOGLOBIN: 13 g/dL (ref 13.0–18.0)
MCH: 26.6 pg (ref 26.0–34.0)
MCHC: 32.9 g/dL (ref 32.0–36.0)
MCV: 81 fL (ref 80.0–100.0)
Platelets: 221 10*3/uL (ref 150–440)
RBC: 4.89 MIL/uL (ref 4.40–5.90)
RDW: 13.9 % (ref 11.5–14.5)
WBC: 5.4 10*3/uL (ref 3.8–10.6)

## 2017-03-19 LAB — BASIC METABOLIC PANEL
Anion gap: 8 (ref 5–15)
BUN: 10 mg/dL (ref 6–20)
CHLORIDE: 104 mmol/L (ref 101–111)
CO2: 26 mmol/L (ref 22–32)
Calcium: 8.9 mg/dL (ref 8.9–10.3)
Creatinine, Ser: 0.64 mg/dL (ref 0.61–1.24)
GFR calc Af Amer: >60 mL/min (ref >60)
GFR calc non Af Amer: >60 mL/min (ref >60)
Glucose, Bld: 107 mg/dL — ABNORMAL HIGH (ref 65–99)
POTASSIUM: 3.6 mmol/L (ref 3.5–5.1)
SODIUM: 138 mmol/L (ref 135–145)

## 2017-03-19 LAB — TROPONIN I: Troponin I: <0.03 ng/mL (ref <0.03)

## 2017-03-19 MED ORDER — IPRATROPIUM-ALBUTEROL 0.5-2.5 (3) MG/3ML IN SOLN
3.0000 mL | Freq: Once | RESPIRATORY_TRACT | Status: AC
  Administered 2017-03-19: 3 mL via RESPIRATORY_TRACT
  Filled 2017-03-19: qty 3

## 2017-03-19 MED ORDER — HYDROCODONE-ACETAMINOPHEN 5-325 MG PO TABS
1.0000 | ORAL_TABLET | Freq: Once | ORAL | Status: AC
  Administered 2017-03-19: 1 via ORAL
  Filled 2017-03-19: qty 1

## 2017-03-19 MED ORDER — ALBUTEROL SULFATE HFA 108 (90 BASE) MCG/ACT IN AERS: 2.0000 | INHALATION_SPRAY | Freq: Four times a day (QID) | RESPIRATORY_TRACT | 2 refills | Status: DC | PRN

## 2017-03-19 MED ORDER — DEXAMETHASONE 4 MG PO TABS
10.0000 mg | ORAL_TABLET | Freq: Once | ORAL | Status: AC
  Administered 2017-03-19: 10 mg via ORAL
  Filled 2017-03-19: qty 2.5

## 2017-03-19 MED ORDER — CYCLOBENZAPRINE HCL 5 MG PO TABS: 5.0000 mg | ORAL_TABLET | Freq: Three times a day (TID) | ORAL | 0 refills | Status: DC | PRN

## 2017-03-19 NOTE — ED Triage Notes (Addendum)
PT arrived with complaints of intermittent chest pain for the last few days. Pt reports sharp pain that is on both left and right side and radiates to his head. Pt states the pain "takes my breath away."

## 2017-03-19 NOTE — ED Triage Notes (Signed)
First Nurse Note:  Arrives with C/O chest pain and SOB x 2-3 days.  States pain is intermittent and radiates up toward throat.    AAOx3.  Skin warm and dry.  No SOB/ DOE.  NAD

## 2017-03-19 NOTE — ED Provider Notes (Signed)
[facility] Emergency Department Provider Note    First MD Initiated Contact with Patient 03/19/17 1506     (approximate)  I have reviewed the triage vital signs and the nursing notes.   HISTORY  Chief Complaint Chest Pain    HPI Ryan Wolfe is a 37 y.o. male presents with 3 days of intermittent chest pain and shortness of breath.  States the pain will radiate up towards his throat and down his arm particular the left arm.  States it lasts several seconds and will "take his breath away".  States he is also having shortness of breath with these episodes and a nonproductive cough.  No fevers.  Has been treated for bronchitis in the past and states that this feels somewhat similar.  No pain tearing or ripping through to his back.  No nausea or vomiting.  No orthopnea.  No lower extremity swelling.  No previous history of heart disease and he does not smoke.  Past Medical History:  Diagnosis Date  . Arthritis   . Chicken pox   . GERD (gastroesophageal reflux disease)   . Headache    Family History  Problem Relation Age of Onset  . Arthritis Unknown        grandparent  . Hypertension Unknown        parent and grandparent  . Diabetes Mother    Past Surgical History:  Procedure Laterality Date  . TONSILLECTOMY AND ADENOIDECTOMY     Patient Active Problem List   Diagnosis Date Noted  . GERD (gastroesophageal reflux disease) 12/24/2016  . Sinusitis 12/24/2016  . Neck pain 11/08/2014  . Prediabetes 11/08/2014  . Chest pain 10/27/2014  . Abdominal pain 10/27/2014  . Headache 10/27/2014  . Low back pain 10/27/2014      Prior to Admission medications   Medication Sig Start Date End Date Taking? Authorizing Provider  amoxicillin (AMOXIL) 875 MG tablet Take 1 tablet (875 mg total) by mouth 2 (two) times daily. 12/22/16   Dale Edith Endave, MD  ibuprofen (ADVIL,MOTRIN) 600 MG tablet Take 1 tablet (600 mg total) by mouth every 8 (eight) hours as needed.  04/09/16   Joni Reining, PA-C  omeprazole (PRILOSEC) 20 MG capsule Take 1 capsule (20 mg total) by mouth daily. 12/22/16   Dale [place], MD    Allergies Patient has no known allergies.    Social History Social History   Tobacco Use  . Smoking status: Current Some Day Smoker  . Smokeless tobacco: Never Used  Substance Use Topics  . Alcohol use: Yes    Alcohol/week: 3.0 oz    Types: 5 Standard drinks or equivalent per week  . Drug use: No    Review of Systems Patient denies headaches, rhinorrhea, blurry vision, numbness, shortness of breath, chest pain, edema, cough, abdominal pain, nausea, vomiting, diarrhea, dysuria, fevers, rashes or hallucinations unless otherwise stated above in HPI. ____________________________________________   PHYSICAL EXAM:  VITAL SIGNS: Vitals:   03/19/17 1415  BP: 126/71  Pulse: 61  Resp: 18  Temp: 98.3 F (36.8 C)  SpO2: 95%    Constitutional: Alert and oriented. Well appearing and in no acute distress. Eyes: Conjunctivae are normal.  Head: Atraumatic. Nose: No congestion/rhinnorhea. Mouth/Throat: Mucous membranes are moist.   Neck: No stridor. Painless ROM.  Cardiovascular: Normal rate, regular rhythm. Grossly normal heart sounds.  Good peripheral circulation. Respiratory: Normal respiratory effort.  No retractions. Lungs with coarse posterior breath sounds Gastrointestinal: Soft and nontender. No distention. No abdominal bruits.  No CVA tenderness. Genitourinary:  Musculoskeletal: No lower extremity tenderness nor edema.  No joint effusions. Neurologic:  Normal speech and language. No gross focal neurologic deficits are appreciated. No facial droop Skin:  Skin is warm, dry and intact. No rash noted. Psychiatric: Mood and affect are normal. Speech and behavior are normal.  ____________________________________________   LABS (all labs ordered are listed, but only abnormal results are displayed)  Results for orders placed or  performed during the hospital encounter of 03/19/17 (from the past 24 hour(s))  Basic metabolic panel     Status: Abnormal   Collection Time: 03/19/17  2:20 PM  Result Value Ref Range   Sodium 138 135 - 145 mmol/L   Potassium 3.6 3.5 - 5.1 mmol/L   Chloride 104 101 - 111 mmol/L   CO2 26 22 - 32 mmol/L   Glucose, Bld 107 (H) 65 - 99 mg/dL   BUN 10 6 - 20 mg/dL   Creatinine, Ser 1.61 0.61 - 1.24 mg/dL   Calcium 8.9 8.9 - 09.6 mg/dL   GFR calc non Af Amer >60 >60 mL/min   GFR calc Af Amer >60 >60 mL/min   Anion gap 8 5 - 15  CBC     Status: Abnormal   Collection Time: 03/19/17  2:20 PM  Result Value Ref Range   WBC 5.4 3.8 - 10.6 K/uL   RBC 4.89 4.40 - 5.90 MIL/uL   Hemoglobin 13.0 13.0 - 18.0 g/dL   HCT 04.5 (L) 40.9 - 81.1 %   MCV 81.0 80.0 - 100.0 fL   MCH 26.6 26.0 - 34.0 pg   MCHC 32.9 32.0 - 36.0 g/dL   RDW 91.4 78.2 - 95.6 %   Platelets 221 150 - 440 K/uL  Troponin I     Status: None   Collection Time: 03/19/17  2:20 PM  Result Value Ref Range   Troponin I <0.03 <0.03 ng/mL   ____________________________________________  EKG My review and personal interpretation at Time: 14:20   Indication: chest pain  Rate: 70  Rhythm: sinus Axis: normal Other: no stemi, normal intervals, no depressions, brugada or wpw ____________________________________________  RADIOLOGY  I personally reviewed all radiographic images ordered to evaluate for the above acute complaints and reviewed radiology reports and findings.  These findings were personally discussed with the patient.  Please see medical record for radiology report.  ____________________________________________   PROCEDURES  Procedure(s) performed:  Procedures    Critical Care performed: no ____________________________________________   INITIAL IMPRESSION / ASSESSMENT AND PLAN / ED COURSE  Pertinent labs & imaging results that were available during my care of the patient were reviewed by me and considered in my  medical decision making (see chart for details).  DDX: ACS, pericarditis, esophagitis, boerhaaves, pe, dissection, pna, bronchitis, costochondritis   Daril Warga is a 37 y.o. who presents to the ED with symptoms as described above.  Patient well-appearing and in no acute distress.  EKG shows no evidence of acute ischemia and his troponin is negative.  Does not seem clinically consistent with pericarditis.  Patient is low risk heart score of 2.  Is not clinically consistent with ACS.  Not clinically consistent with dissection.  He is low risk by Wells criteria and is PERC negative.  Does seem to have some reproducibility on palpation of his chest wall and posterior back.  Given his coarse breath sounds and history of bronchitis I do suspect some component of underlying bronchitis resulting in muscular skeletal discomfort.  Will treat  with nebulizer, Decadron and pain medication.  Patient was able to tolerate PO and was able to ambulate with a steady gait.  Have discussed with the patient and available family all diagnostics and treatments performed thus far and all questions were answered to the best of my ability. The patient demonstrates understanding and agreement with plan.       ____________________________________________   FINAL CLINICAL IMPRESSION(S) / ED DIAGNOSES  Final diagnoses:  Chest pain, unspecified type  Bronchitis      NEW MEDICATIONS STARTED DURING THIS VISIT:  New Prescriptions   No medications on file     Note:  This document was prepared using Dragon voice recognition software and may include unintentional dictation errors.    Willy Eddyobinson, Owin Vignola, MD 03/19/17 [id number]

## 2017-03-19 NOTE — Discharge Instructions (Signed)
Return to ER for any increase in pain, if the pain changes or becomes worse with physical activity, you have shortness of breath, nausea or vomiting associated with the chest pain. ° °

## 2017-05-29 IMAGING — CR DG CERVICAL SPINE COMPLETE 4+V
5 series · 5 of 5 positions shown · non-contrast
Comparison: None in PACs

CLINICAL DATA: Intermittent left arm numbness

EXAM:
CERVICAL SPINE  4+ VIEWS

[view not recorded (1 of 5)]
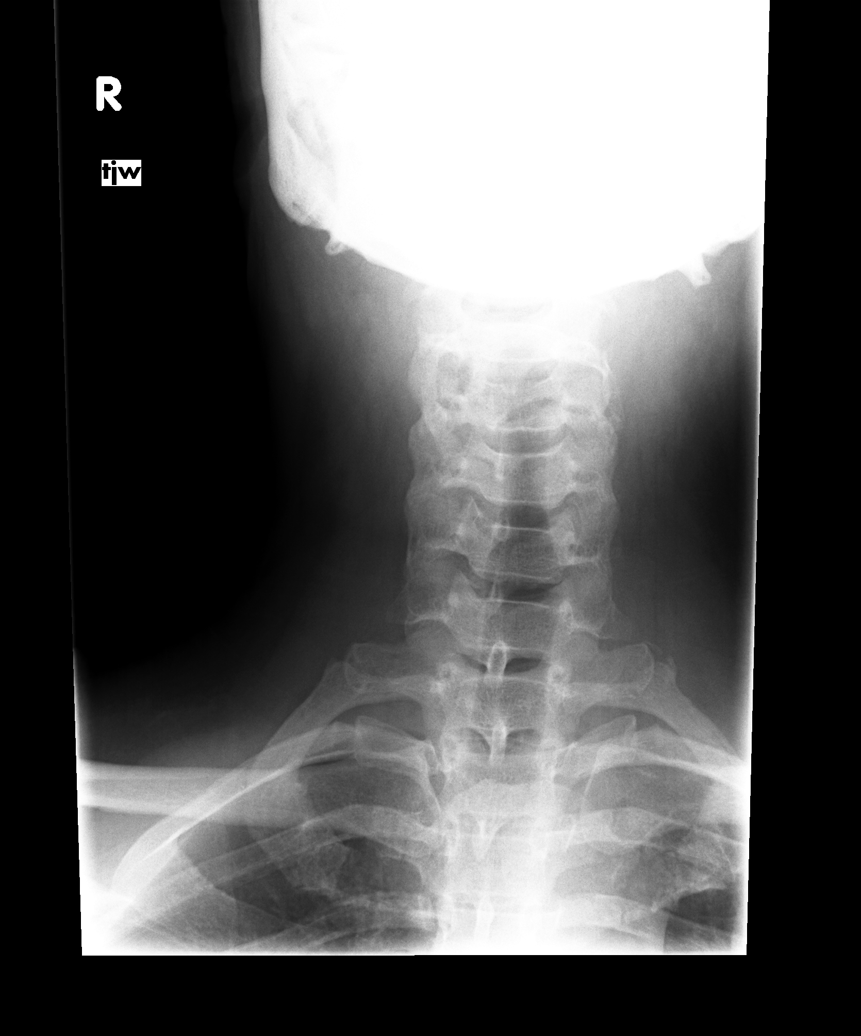

[view not recorded (2 of 5)]
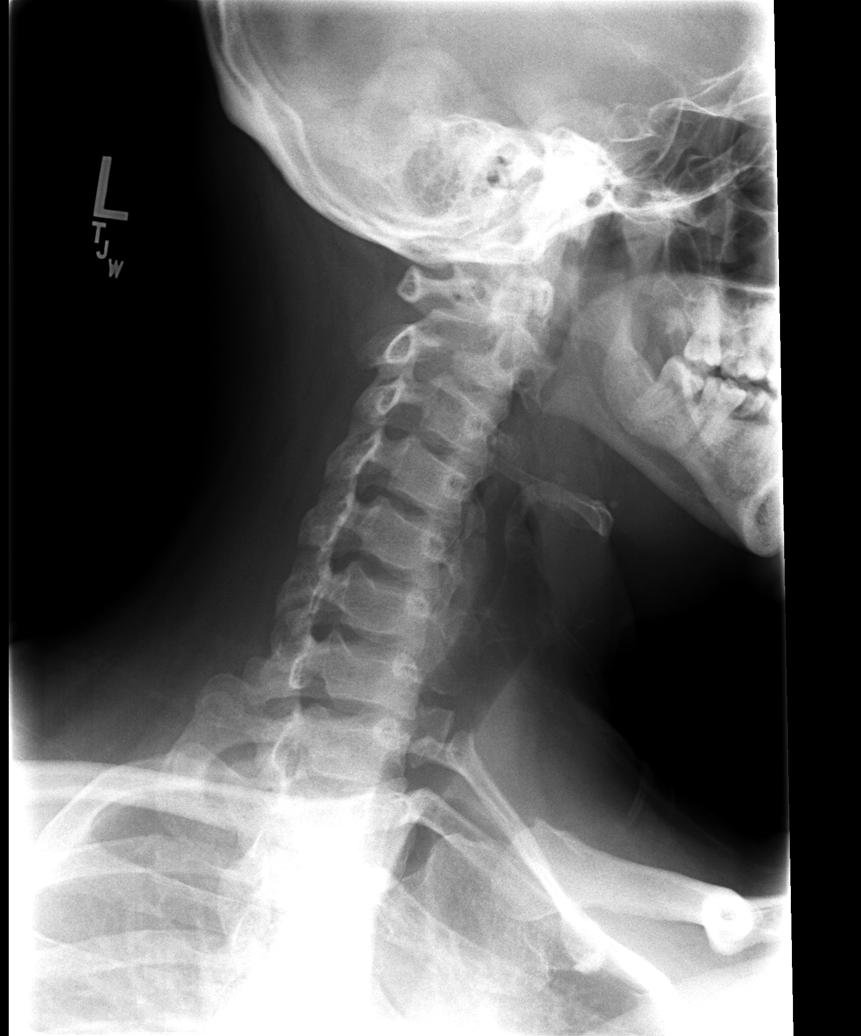

[view not recorded (3 of 5)]
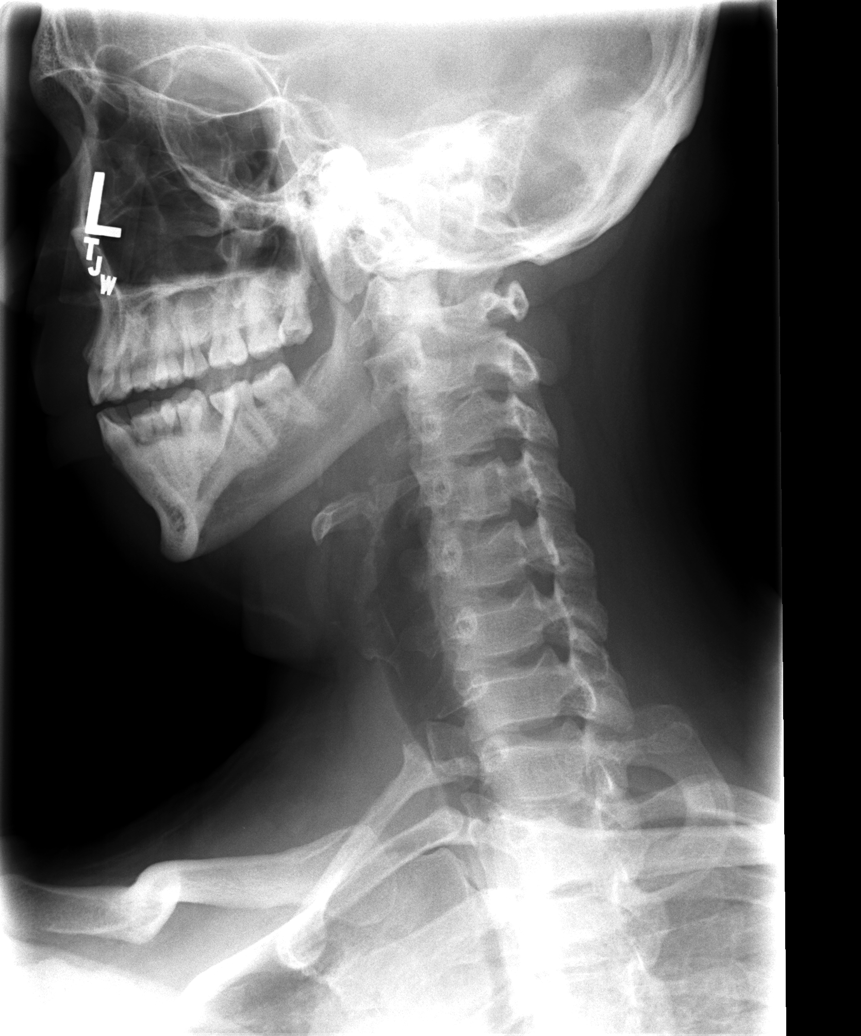

[view not recorded (4 of 5)]
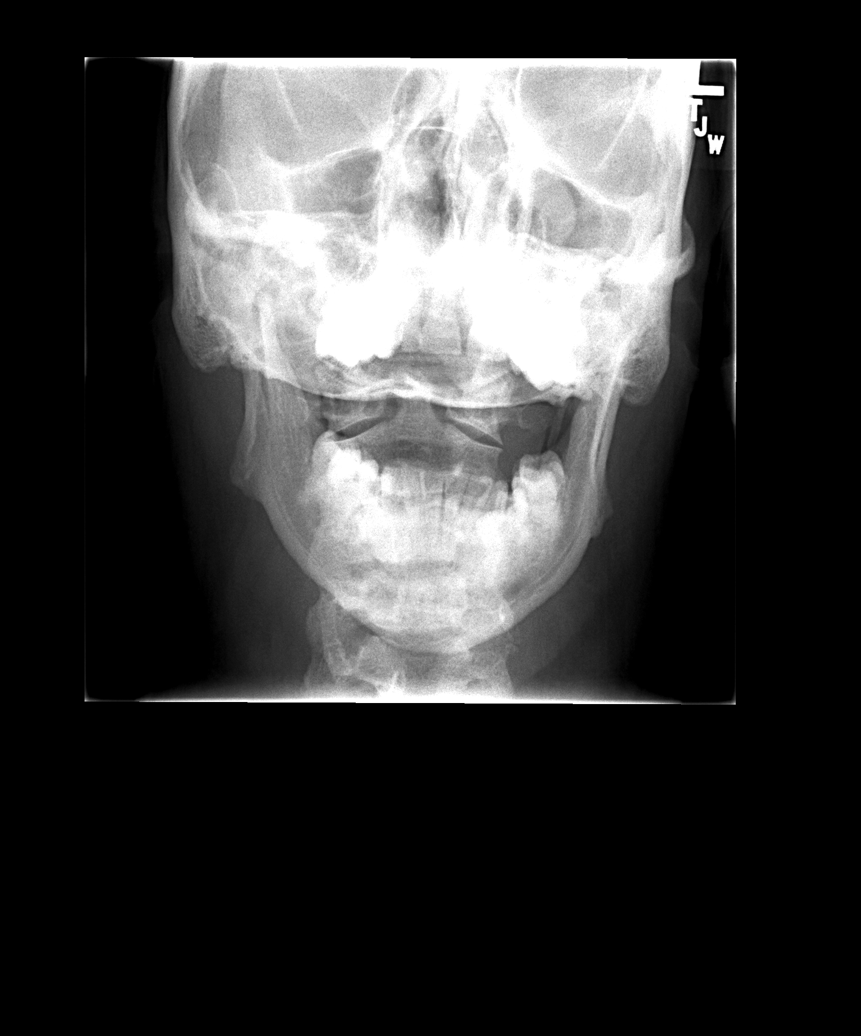

[view not recorded (5 of 5)]
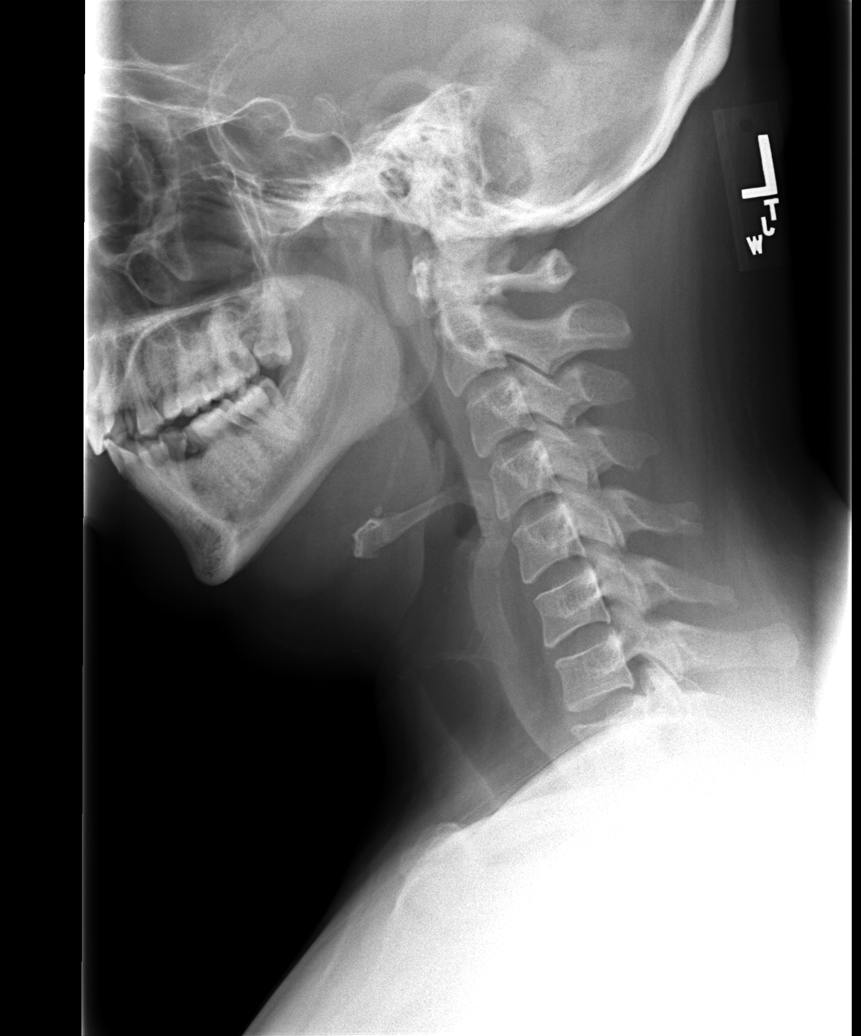

[5 of 5 positions shown; findings below may reference images not displayed]

FINDINGS: The cervical vertebral bodies are preserved in height. There is loss
of the normal cervical lordosis. The disc space heights are well
maintained. There is no perched facet. The oblique views reveal no
bony encroachment upon the neural foramina. The odontoid is intact.
The prevertebral soft tissue spaces are normal.
IMPRESSION: There is no acute bony abnormality of the cervical spine. Loss of
the normal cervical lordosis likely reflects muscle spasm.

## 2017-06-23 ENCOUNTER — Emergency Department
Admission: EM | Admit: 2017-06-23 | Discharge: 2017-06-23 | Disposition: A | Discharge status: Home / Self Care | Payer: BLUE CROSS/BLUE SHIELD | Attending: Emergency Medicine | Admitting: Emergency Medicine

## 2017-06-23 ENCOUNTER — Encounter: Payer: Self-pay | Admitting: Emergency Medicine

## 2017-06-23 ENCOUNTER — Other Ambulatory Visit: Payer: Self-pay

## 2017-06-23 DIAGNOSIS — Z5321 Procedure and treatment not carried out due to patient leaving prior to being seen by health care provider: Secondary | ICD-10-CM | POA: Insufficient documentation

## 2017-06-23 DIAGNOSIS — J029 Acute pharyngitis, unspecified: Secondary | ICD-10-CM | POA: Insufficient documentation

## 2017-06-23 LAB — GROUP A STREP BY PCR: GROUP A STREP BY PCR: NOT DETECTED

## 2017-06-23 NOTE — ED Triage Notes (Addendum)
Pt presents to ED with excess "phlem" in the back of his throat. Pt states when he coughs it up he has noticed a small amount red streaking in it. Ongoing for several days. Reports a "burning" feeling in his throat. Denies fever.

## 2017-10-14 ENCOUNTER — Encounter: Payer: Self-pay | Admitting: Family Medicine

## 2017-10-14 ENCOUNTER — Other Ambulatory Visit: Payer: Self-pay

## 2017-10-14 ENCOUNTER — Ambulatory Visit (INDEPENDENT_AMBULATORY_CARE_PROVIDER_SITE_OTHER): Payer: Managed Care, Other (non HMO)

## 2017-10-14 ENCOUNTER — Ambulatory Visit: Payer: Managed Care, Other (non HMO) | Admitting: Family Medicine

## 2017-10-14 VITALS — BP 138/86 | HR 55 | Temp 97.9°F | Wt 185.8 lb

## 2017-10-14 DIAGNOSIS — E559 Vitamin D deficiency, unspecified: Secondary | ICD-10-CM | POA: Diagnosis not present

## 2017-10-14 DIAGNOSIS — R079 Chest pain, unspecified: Secondary | ICD-10-CM

## 2017-10-14 DIAGNOSIS — E538 Deficiency of other specified B group vitamins: Secondary | ICD-10-CM

## 2017-10-14 DIAGNOSIS — R5383 Other fatigue: Secondary | ICD-10-CM

## 2017-10-14 DIAGNOSIS — R198 Other specified symptoms and signs involving the digestive system and abdomen: Secondary | ICD-10-CM | POA: Diagnosis not present

## 2017-10-14 LAB — CBC
HEMATOCRIT: 39 % (ref 39.0–52.0)
Hemoglobin: 13 g/dL (ref 13.0–17.0)
MCHC: 33.3 g/dL (ref 30.0–36.0)
MCV: 80.3 fl (ref 78.0–100.0)
Platelets: 227 10*3/uL (ref 150.0–400.0)
RBC: 4.86 Mil/uL (ref 4.22–5.81)
RDW: 14 % (ref 11.5–15.5)
WBC: 4.5 10*3/uL (ref 4.0–10.5)

## 2017-10-14 LAB — COMPREHENSIVE METABOLIC PANEL
ALBUMIN: 4.4 g/dL (ref 3.5–5.2)
ALT: 19 U/L (ref 0–53)
AST: 17 U/L (ref 0–37)
Alkaline Phosphatase: 34 U/L — ABNORMAL LOW (ref 39–117)
BUN: 13 mg/dL (ref 6–23)
CALCIUM: 9.2 mg/dL (ref 8.4–10.5)
CO2: 34 mEq/L — ABNORMAL HIGH (ref 19–32)
Chloride: 103 mEq/L (ref 96–112)
Creatinine, Ser: 0.72 mg/dL (ref 0.40–1.50)
GFR: 156.45 mL/min (ref >60.00)
Glucose, Bld: 100 mg/dL — ABNORMAL HIGH (ref 70–99)
POTASSIUM: 4 meq/L (ref 3.5–5.1)
Sodium: 140 mEq/L (ref 135–145)
TOTAL PROTEIN: 7.5 g/dL (ref 6.0–8.3)
Total Bilirubin: 0.5 mg/dL (ref 0.2–1.2)

## 2017-10-14 LAB — THYROID PANEL WITH TSH
Free Thyroxine Index: 2.4 (ref 1.4–3.8)
T3 UPTAKE: 32 % (ref 22–35)
T4, Total: 7.6 ug/dL (ref 4.9–10.5)
TSH: 0.87 m[IU]/L (ref 0.40–4.50)

## 2017-10-14 LAB — TROPONIN I: TNIDX: 0 ug/L (ref 0.00–0.06)

## 2017-10-14 LAB — B12 AND FOLATE PANEL
FOLATE: 14.7 ng/mL (ref >5.9)
VITAMIN B 12: 155 pg/mL — AB (ref 211–911)

## 2017-10-14 LAB — VITAMIN D 25 HYDROXY (VIT D DEFICIENCY, FRACTURES): VITD: 15.12 ng/mL — AB (ref 30.00–100.00)

## 2017-10-14 NOTE — Progress Notes (Signed)
Subjective:    Patient ID: Ryan Wolfe, male    DOB: 1980-06-08, 37 y.o.   MRN: [id number]  HPI   Presents to clinic /co Chest pain & fatigue for 2-3 weeks  Works full time job and has part time job as a bar back as well, does not get great sleep.   Feels very drained almost all of the time  Has pain all over chest and upper ABD, feels full, almost like a gas pain/pressure.  Patient had been taking omeprazole to treat GERD in the past but currently is on no medications.  No hx of blood clotting disorder. No recent long car rides or plane rides.    Patient Active Problem List   Diagnosis Date Noted  . Fatigue 10/14/2017  . GERD (gastroesophageal reflux disease) 12/24/2016  . Sinusitis 12/24/2016  . Neck pain 11/08/2014  . Prediabetes 11/08/2014  . Chest pain 10/27/2014  . Abdominal pain 10/27/2014  . Headache 10/27/2014  . Low back pain 10/27/2014   Social History   Tobacco Use  . Smoking status: Current Some Day Smoker  . Smokeless tobacco: Never Used  Substance Use Topics  . Alcohol use: Yes    Alcohol/week: 5.0 standard drinks    Types: 5 Standard drinks or equivalent per week   Review of Systems  Constitutional: +fatigue. Negative for chills, and fever.  HENT: Negative for congestion, ear pain, sinus pain and sore throat.   Eyes: Negative.   Respiratory: Negative for cough, shortness of breath and wheezing.   Cardiovascular: +chest pain. Negative for  palpitations and leg swelling.  Gastrointestinal: +upper ABD pain/fullness. Negative for diarrhea, nausea and vomiting.  Genitourinary: Negative for dysuria, frequency and urgency.  Musculoskeletal: Negative for arthralgias and myalgias.  Skin: Negative for color change, pallor and rash.  Neurological: Negative for syncope, light-headedness and headaches.  Psychiatric/Behavioral: The patient is not nervous/anxious.       Objective:   Physical Exam  Constitutional: He is oriented to person, place, and time.  He appears well-developed and well-nourished. No distress.  Head: Normocephalic and atraumatic.  Eyes: Pupils are equal, round, and reactive to light. Conjunctivae and EOM are normal. No scleral icterus.  Neck: Normal range of motion. Neck supple. No tracheal deviation present.  Cardiovascular: Bradycardia. S1 S2 normal. No murmer.  Pulmonary/Chest: Effort normal and breath sounds normal. No respiratory distress. He has no wheezes. He has no rales.  Abdominal: Soft. Bowel sounds are normal. There is no tenderness.  Neurological: He is alert and oriented to person, place, and time. Gait normal  Skin: Skin is warm and dry. He is not diaphoretic. No pallor.  Psychiatric: He has a normal mood and affect. His behavior is normal. Thought content normal.   Nursing note and vitals reviewed.    Vitals:   10/14/17 1029  BP: 138/86  Pulse: (!) 55  Temp: 97.9 F (36.6 C)  SpO2: 97%   Assessment & Plan:   Chest pain -- I personally reviewed EKG tracing and does show brady cardia with QRS widening. I discussed with Dr Birdie SonsSonnenberg who suggest I reach out cardiologist - Dr Mariah MillingGollan reviewed EKG, states looks OK and no acute concerns. CXR done in clinic. Troponin drawn in lab work.   Fatigue -- Lab work ordered to further assess fatigue.   ABD fullness -- lab work will assess electrolyte levels, liver and kidney functions.  Feelings of abdominal fullness could be related to uncontrolled acid reflux, patient had taken omeprazole in the past.  We will wait until we have lab work results before adding omeprazole back to medication regimen.  If chest pain continues to persist, we will plan to refer to cardiology for complete cardiac work up  We will base need for next follow-up appointment once all lab results have returned.  Patient advised if chest pain worsens, he develops shortness of breath, sweating, feeling faint, having palpitations to call office and or go to emergency department right away for  full evaluation.

## 2017-10-15 MED ORDER — ERGOCALCIFEROL 1.25 MG (50000 UT) PO CAPS: 50000.0000 [IU] | ORAL_CAPSULE | ORAL | 0 refills | Status: DC

## 2017-10-15 MED ORDER — VITAMIN B-12 1000 MCG PO TABS: 1000.0000 ug | ORAL_TABLET | Freq: Every day | ORAL | 0 refills | Status: DC

## 2017-10-15 NOTE — Addendum Note (Signed)
Addended by: Leanora CoverGUSE, Lilibeth Opie on: 10/15/2017 01:34 PM   Modules accepted: Orders

## 2017-11-26 ENCOUNTER — Ambulatory Visit: Payer: Managed Care, Other (non HMO) | Admitting: Family Medicine

## 2017-12-01 ENCOUNTER — Ambulatory Visit: Payer: Managed Care, Other (non HMO) | Admitting: Family Medicine

## 2017-12-01 NOTE — Progress Notes (Deleted)
   Subjective:    Patient ID: Ryan Wolfe, male    DOB: 1980-03-14, 37 y.o.   MRN: [id number]  HPI  Patient presents to clinic to follow-up after starting vitamin D and B12 supplements after deficiency found in lab work.   Patient Active Problem List   Diagnosis Date Noted  . Fatigue 10/14/2017  . Abdominal fullness 10/14/2017  . GERD (gastroesophageal reflux disease) 12/24/2016  . Sinusitis 12/24/2016  . Neck pain 11/08/2014  . Prediabetes 11/08/2014  . Chest pain 10/27/2014  . Abdominal pain 10/27/2014  . Headache 10/27/2014  . Low back pain 10/27/2014   Social History   Tobacco Use  . Smoking status: Current Some Day Smoker  . Smokeless tobacco: Never Used  Substance Use Topics  . Alcohol use: Yes    Alcohol/week: 5.0 standard drinks    Types: 5 Standard drinks or equivalent per week   Review of Systems  Constitutional: Negative for chills, fatigue and fever.  HENT: Negative for congestion, ear pain, sinus pain and sore throat.   Eyes: Negative.   Respiratory: Negative for cough, shortness of breath and wheezing.   Cardiovascular: Negative for chest pain, palpitations and leg swelling.  Gastrointestinal: Negative for abdominal pain, diarrhea, nausea and vomiting.  Genitourinary: Negative for dysuria, frequency and urgency.  Musculoskeletal: Negative for arthralgias and myalgias.  Skin: Negative for color change, pallor and rash.  Neurological: Negative for syncope, light-headedness and headaches.  Psychiatric/Behavioral: The patient is not nervous/anxious.       Objective:   Physical Exam        Assessment & Plan:

## 2018-02-01 ENCOUNTER — Encounter: Payer: Managed Care, Other (non HMO) | Admitting: Family Medicine

## 2018-02-01 DIAGNOSIS — Z0289 Encounter for other administrative examinations: Secondary | ICD-10-CM

## 2018-09-15 ENCOUNTER — Other Ambulatory Visit: Payer: Self-pay

## 2018-09-15 ENCOUNTER — Ambulatory Visit (INDEPENDENT_AMBULATORY_CARE_PROVIDER_SITE_OTHER): Payer: Managed Care, Other (non HMO) | Admitting: Family Medicine

## 2018-09-15 DIAGNOSIS — Z20822 Contact with and (suspected) exposure to covid-19: Secondary | ICD-10-CM

## 2018-09-15 DIAGNOSIS — J029 Acute pharyngitis, unspecified: Secondary | ICD-10-CM | POA: Diagnosis not present

## 2018-09-15 DIAGNOSIS — Z20828 Contact with and (suspected) exposure to other viral communicable diseases: Secondary | ICD-10-CM

## 2018-09-15 DIAGNOSIS — R110 Nausea: Secondary | ICD-10-CM

## 2018-09-15 DIAGNOSIS — R52 Pain, unspecified: Secondary | ICD-10-CM | POA: Diagnosis not present

## 2018-09-15 MED ORDER — ONDANSETRON 4 MG PO TBDP: 4.0000 mg | ORAL_TABLET | Freq: Three times a day (TID) | ORAL | 1 refills | Status: DC | PRN

## 2018-09-15 NOTE — Progress Notes (Signed)
Patient ID: Ryan PeacockRobert Wolfe, male   DOB: 09/12/1980, 38 y.o.   MRN: [id number]    Virtual Visit via video Note  This visit type was conducted due to national recommendations for restrictions regarding the COVID-19 pandemic (e.g. social distancing).  This format is felt to be most appropriate for this patient at this time.  All issues noted in this document were discussed and addressed.  No physical exam was performed (except for noted visual exam findings with Video Visits).   I connected with Ryan Peacockobert Wolfe today at 11:20 AM EDT by a video enabled telemedicine application and verified that I am speaking with the correct person using two identifiers. Location patient: home Location provider: work or home office Persons participating in the virtual visit: patient, provider  I discussed the limitations, risks, security and privacy concerns of performing an evaluation and management service by video and the availability of in person appointments. I also discussed with the patient that there may be a patient responsible charge related to this service. The patient expressed understanding and agreed to proceed.  HPI:  Patient and I connected via video to discuss symptoms of sore throat, body aches, generally feeling unwell and some nausea.  Symptoms have been present for about 2 days.  Unknown if he has been that he would not who has been ill.  Denies fever or chills.  Denies shortness of breath or wheezing.  Denies vomiting.  Denies chest pain.  Appetite has been decreased due to nauseousness, but has been trying to keep up good fluid intake.   ROS: See pertinent positives and negatives per HPI.  Past Medical History:  Diagnosis Date  . Arthritis   . Chicken pox   . GERD (gastroesophageal reflux disease)   . Headache     Past Surgical History:  Procedure Laterality Date  . TONSILLECTOMY AND ADENOIDECTOMY    . UPPER GI ENDOSCOPY      Family History  Problem Relation Age of Onset  .  Arthritis Unknown        grandparent  . Hypertension Unknown        parent and grandparent  . Diabetes Mother    Social History   Tobacco Use  . Smoking status: Current Some Day Smoker  . Smokeless tobacco: Never Used  Substance Use Topics  . Alcohol use: Yes    Alcohol/week: 5.0 standard drinks    Types: 5 Standard drinks or equivalent per week    Current Outpatient Medications:  .  ergocalciferol (VITAMIN D2) 50000 units capsule, Take 1 capsule (50,000 Units total) by mouth once a week., Disp: 12 capsule, Rfl: 0 .  vitamin B-12 (CYANOCOBALAMIN) 1000 MCG tablet, Take 1 tablet (1,000 mcg total) by mouth daily., Disp: 90 tablet, Rfl: 0  EXAM:  GENERAL: alert, oriented, appears well and in no acute distress  HEENT: atraumatic, conjunttiva clear, no obvious abnormalities on inspection of external nose and ears  NECK: normal movements of the head and neck  LUNGS: on inspection no signs of respiratory distress, breathing rate appears normal, no obvious gross SOB, gasping or wheezing  CV: no obvious cyanosis  MS: moves all visible extremities without noticeable abnormality  PSYCH/NEURO: pleasant and cooperative, no obvious depression or anxiety, speech and thought processing grossly intact  ASSESSMENT AND PLAN:  Discussed the following assessment and plan:  Suspected COVID-19 virus infection, sore throat, body aches - advised that due to symptoms, we need to get patient set up for COVID-19 testing.  Patient advised that I  will put order in and he can go to testing location for for drive-through testing. Patient given the address of testing site.  Patient advised that testing is taking 2 to 7 days to result, and while we are awaiting results patient must remain under self quarantine and monitor for any changing/worsening symptoms.  Advised over-the-counter medications such as Tylenol can be used to help treat pain or fevers, Robitussin can be used to help calm cough, allergy  medication such as Claritin or Allegra can help reduce congestion.  Zofran sent to use PRN nausea. Also discussed getting plenty of rest and increasing fluid intake.  Made patient aware that test results as well as how his symptoms progress will determine when the self quarantine will be able to end.  Also advised to monitor self for any worsening symptoms, advised if severe shortness of breath develops, high fever that is not reduced with use of Tylenol, chest pain, severe vomiting or diarrhea  --patient must call on-call and or go to ER right away for evaluation. patient verbalized understanding of these instructions.  Out of work note placed in his MyChart   I discussed the assessment and treatment plan with the patient. The patient was provided an opportunity to ask questions and all were answered. The patient agreed with the plan and demonstrated an understanding of the instructions.   The patient was advised to call back or seek an in-person evaluation if the symptoms worsen or if the condition fails to improve as anticipated.   Ryan Green, FNP

## 2018-09-16 ENCOUNTER — Encounter: Payer: Self-pay | Admitting: Family Medicine

## 2018-09-16 ENCOUNTER — Telehealth: Payer: Self-pay

## 2018-09-16 LAB — NOVEL CORONAVIRUS, NAA: SARS-CoV-2, NAA: NOT DETECTED

## 2018-09-16 NOTE — Telephone Encounter (Signed)
Copied from [place] [id number]. Topic: General - Inquiry >> Sep 16, 2018  2:34 PM Percell Belt A wrote: Reason for CRM: pt called in and would like to know now that results are NEG he is going to need a updated letter saying it is ok for him to return to work due to NEG results

## 2018-09-17 NOTE — Telephone Encounter (Signed)
pt called in and would like to know now that results are NEG he is going to need a updated letter saying it is ok for him to return to work due to NEG results

## 2018-09-17 NOTE — Telephone Encounter (Signed)
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

## 2018-11-08 ENCOUNTER — Other Ambulatory Visit: Payer: Self-pay

## 2018-11-09 ENCOUNTER — Ambulatory Visit (INDEPENDENT_AMBULATORY_CARE_PROVIDER_SITE_OTHER): Payer: Managed Care, Other (non HMO) | Admitting: Family Medicine

## 2018-11-09 ENCOUNTER — Ambulatory Visit (INDEPENDENT_AMBULATORY_CARE_PROVIDER_SITE_OTHER): Payer: Managed Care, Other (non HMO)

## 2018-11-09 ENCOUNTER — Other Ambulatory Visit: Payer: Self-pay

## 2018-11-09 ENCOUNTER — Encounter: Payer: Self-pay | Admitting: Family Medicine

## 2018-11-09 VITALS — BP 140/80 | HR 59 | Temp 97.4°F | Ht 66.0 in | Wt 190.4 lb

## 2018-11-09 DIAGNOSIS — R0609 Other forms of dyspnea: Secondary | ICD-10-CM

## 2018-11-09 DIAGNOSIS — R0600 Dyspnea, unspecified: Secondary | ICD-10-CM

## 2018-11-09 DIAGNOSIS — G8929 Other chronic pain: Secondary | ICD-10-CM

## 2018-11-09 DIAGNOSIS — M25562 Pain in left knee: Secondary | ICD-10-CM | POA: Diagnosis not present

## 2018-11-09 DIAGNOSIS — K5900 Constipation, unspecified: Secondary | ICD-10-CM | POA: Diagnosis not present

## 2018-11-09 DIAGNOSIS — F419 Anxiety disorder, unspecified: Secondary | ICD-10-CM

## 2018-11-09 DIAGNOSIS — G44209 Tension-type headache, unspecified, not intractable: Secondary | ICD-10-CM

## 2018-11-09 DIAGNOSIS — E669 Obesity, unspecified: Secondary | ICD-10-CM

## 2018-11-09 LAB — HEMOGLOBIN A1C: Hgb A1c MFr Bld: 6.8 % — ABNORMAL HIGH (ref 4.6–6.5)

## 2018-11-09 LAB — CBC
HCT: 37 % — ABNORMAL LOW (ref 39.0–52.0)
Hemoglobin: 12.3 g/dL — ABNORMAL LOW (ref 13.0–17.0)
MCHC: 33.2 g/dL (ref 30.0–36.0)
MCV: 81.7 fl (ref 78.0–100.0)
Platelets: 236 10*3/uL (ref 150.0–400.0)
RBC: 4.53 Mil/uL (ref 4.22–5.81)
RDW: 13.6 % (ref 11.5–15.5)
WBC: 5.1 10*3/uL (ref 4.0–10.5)

## 2018-11-09 LAB — COMPREHENSIVE METABOLIC PANEL
ALT: 24 U/L (ref 0–53)
AST: 19 U/L (ref 0–37)
Albumin: 4.5 g/dL (ref 3.5–5.2)
Alkaline Phosphatase: 35 U/L — ABNORMAL LOW (ref 39–117)
BUN: 10 mg/dL (ref 6–23)
CO2: 30 mEq/L (ref 19–32)
Calcium: 9 mg/dL (ref 8.4–10.5)
Chloride: 104 mEq/L (ref 96–112)
Creatinine, Ser: 0.68 mg/dL (ref 0.40–1.50)
GFR: 156.37 mL/min (ref >60.00)
Glucose, Bld: 107 mg/dL — ABNORMAL HIGH (ref 70–99)
Potassium: 3.8 mEq/L (ref 3.5–5.1)
Sodium: 140 mEq/L (ref 135–145)
Total Bilirubin: 0.3 mg/dL (ref 0.2–1.2)
Total Protein: 6.9 g/dL (ref 6.0–8.3)

## 2018-11-09 LAB — TSH: TSH: 1.34 u[IU]/mL (ref 0.35–4.50)

## 2018-11-09 LAB — LIPID PANEL
Cholesterol: 138 mg/dL (ref 0–200)
HDL: 31.3 mg/dL — ABNORMAL LOW (ref >39.00)
LDL Cholesterol: 92 mg/dL (ref 0–99)
NonHDL: 106.64
Total CHOL/HDL Ratio: 4
Triglycerides: 74 mg/dL (ref 0.0–149.0)
VLDL: 14.8 mg/dL (ref 0.0–40.0)

## 2018-11-09 MED ORDER — ESCITALOPRAM OXALATE 10 MG PO TABS: 10.0000 mg | ORAL_TABLET | Freq: Every day | ORAL | 3 refills | Status: DC

## 2018-11-09 MED ORDER — POLYETHYLENE GLYCOL 3350 17 GM/SCOOP PO POWD: 17.0000 g | Freq: Every day | ORAL | 0 refills | Status: DC | PRN

## 2018-11-09 NOTE — Patient Instructions (Signed)
Nice to see you. We are going to treat your anxiety with Lexapro.  If you have side effects from this please let us know. We will contact you with your lab results and x-ray results. Please stop the Tylenol for your headaches.  I think this is contributing to them.  You could take this once a day for 5 to 7 days and then discontinue it. Please try the MiraLAX for your constipation. We will get you to see an orthopedist for your knee. If you develop persistent abdominal pain, blood in your stool, worsening headaches, chest pain, shortness of breath, or any new or changing symptoms please be reevaluated.

## 2018-11-10 ENCOUNTER — Encounter: Payer: Self-pay | Admitting: Family Medicine

## 2018-11-11 DIAGNOSIS — M25562 Pain in left knee: Secondary | ICD-10-CM | POA: Insufficient documentation

## 2018-11-11 DIAGNOSIS — K5900 Constipation, unspecified: Secondary | ICD-10-CM | POA: Insufficient documentation

## 2018-11-11 DIAGNOSIS — R0600 Dyspnea, unspecified: Secondary | ICD-10-CM | POA: Insufficient documentation

## 2018-11-11 DIAGNOSIS — F419 Anxiety disorder, unspecified: Secondary | ICD-10-CM | POA: Insufficient documentation

## 2018-11-11 DIAGNOSIS — R0609 Other forms of dyspnea: Secondary | ICD-10-CM | POA: Insufficient documentation

## 2018-11-11 DIAGNOSIS — G8929 Other chronic pain: Secondary | ICD-10-CM | POA: Insufficient documentation

## 2018-11-11 NOTE — Progress Notes (Signed)
Tommi Rumps, MD Phone: [phone]  Ryan Wolfe is a 38 y.o. male who presents today for follow-up.  Abdominal pain/constipation: Patient notes his abdomen only hurts when he is constipated.  He will have diarrhea at times.  He will have constipation where he goes 2-3 times a week with rabbit ball sized stools and then occasionally have diarrhea.  This has been going on about a month.  No blood in his stool.  He typically in the past has had about a bowel movement a day.  Anxiety: Patient notes this occurs typically at night.  He will wake up and his heart will be racing.  His brain will be scattered.  This has been going on for quite a while.  Notes it is harder to control now.  There have been no big changes in his life.  He does note some mild daytime anxiety.  No depression.  Left knee discomfort: Patient notes it feels as though there is a heat in the internal portion of his knee on the left side.  He notes no injury.  He notes he has been wearing a brace as that has been helpful.  He notes this has been going on for several months.  It locked up on him once.  It does not give out on him.  No swelling or pain.  Shortness of breath: Patient notes this has been going on for quite some time.  Most days it occurs.  Occurs mostly when he is active though can occur at rest.  He notes it does occur at nighttime.  He will feel tightness in his chest with this though that occurs very rarely.  He has rare cough with phlegm.  No wheezing.  No fever.  He is a former smoker and quit about a month ago.  He smoked for 15 to 20 years 1 to 2 packs a day.  In the past he was diagnosed with asthma and was on Advair which did help some.  Headache: He notes he has been having these daily for most of the year.  He takes Tylenol twice daily.  Notes his whole head hurts.  Occasionally he will have blurry vision in the morning though that has been chronic and going on longer than the headaches.  No numbness,  weakness, or dizziness.  Social History   Tobacco Use  Smoking Status Current Some Day Smoker  Smokeless Tobacco Never Used     ROS see history of present illness  Objective  Physical Exam Vitals:   11/09/18 0943  BP: 140/80  Pulse: (!) 59  Temp: (!) 97.4 F (36.3 C)  SpO2: 98%    BP Readings from Last 3 Encounters:  11/09/18 140/80  10/14/17 138/86  06/23/17 134/89   Wt Readings from Last 3 Encounters:  11/09/18 190 lb 6.4 oz (86.4 kg)  10/14/17 185 lb 12.8 oz (84.3 kg)  06/23/17 185 lb (83.9 kg)    Physical Exam Constitutional:      General: He is not in acute distress.    Appearance: He is not diaphoretic.  HENT:     Head: Normocephalic and atraumatic.     Mouth/Throat:     Mouth: Mucous membranes are moist.     Pharynx: Oropharynx is clear.  Eyes:     Conjunctiva/sclera: Conjunctivae normal.     Pupils: Pupils are equal, round, and reactive to light.  Cardiovascular:     Rate and Rhythm: Normal rate and regular rhythm.     Heart sounds:  Normal heart sounds.  Pulmonary:     Effort: Pulmonary effort is normal.     Breath sounds: Normal breath sounds.  Abdominal:     General: Bowel sounds are normal. There is no distension.     Palpations: Abdomen is soft.     Tenderness: There is no abdominal tenderness. There is no guarding or rebound.  Musculoskeletal:     Comments: Bilateral knees are symmetric with no swelling, warmth, or erythema, or tenderness, no ligamentous laxity, negative McMurray's  Skin:    General: Skin is warm and dry.  Neurological:     Mental Status: He is alert.     Comments: CN 2-12 intact, 5/5 strength in bilateral biceps, triceps, grip, quads, hamstrings, plantar and dorsiflexion, sensation to light touch intact in bilateral UE and LE, normal gait, 2+ patellar reflexes    EKG: Sinus bradycardia, rate 53, nonspecific QRS widening, nonspecific T wave inversions, stable from prior  Assessment/Plan: Please see individual problem  list.  Dyspnea on exertion This is a chronic issue.  I suspect there may be some underlying reactive airway disease that is contributing given his prior history of Advair use and asthma.  EKG is reassuring.  Stable from prior EKG.  Will obtain a chest x-ray and lab work to evaluate further.  Consider starting medication for reactive airway disease  if evaluation is negative.  Chronic pain of left knee Undetermined cause.  It is not description of the discomfort.  Will refer to orthopedics.  Constipation I suspect the patient has constipated and having the diarrhea around the constipation.  We will treat with MiraLAX.  If not improving after about a week he will let us know.  Anxiety New issue to me.  Will start on Lexapro.  He will monitor.  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  He will discontinue Tylenol.  If not improving with stopping Tylenol he will let us know.   Orders Placed This Encounter  Procedures  . DG Chest 2 View    Standing Status:   Future    Number of Occurrences:   1    Standing Expiration Date:   01/09/2020    Order Specific Question:   Reason for Exam (SYMPTOM  OR DIAGNOSIS REQUIRED)    Answer:   chronic dyspnea, minimal intermittent cough, occasional chest tightness    Order Specific Question:   Preferred imaging location?    Answer:   [facility] Specific Question:   Radiology Contrast Protocol - do NOT remove file path    Answer:   _0 [email or web address]  . Comp Met (CMET)  . CBC  . TSH  . HgB A1c  . Lipid panel  . Ambulatory referral to Orthopedic Surgery    Referral Priority:   Routine    Referral Type:   Surgical    Referral Reason:   Specialty Services Required    Requested Specialty:   Orthopedic Surgery    Number of Visits Requested:   1  . EKG 12-Lead    Meds ordered  this encounter  Medications  . escitalopram (LEXAPRO) 10 MG tablet    Sig: Take 1 tablet (10 mg total) by mouth daily.    Dispense:  30 tablet    Refill:  3  . polyethylene glycol powder (GLYCOLAX/MIRALAX) 17 GM/SCOOP powder    Sig: Take 17 g by mouth daily as needed  for mild constipation.    Dispense:  500 g    Refill:  0     Tommi Rumps, MD [place]

## 2018-11-11 NOTE — Assessment & Plan Note (Signed)
New issue to me.  Will start on Lexapro.  He will monitor.

## 2018-11-11 NOTE — Assessment & Plan Note (Signed)
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  He will discontinue Tylenol.  If not improving with stopping Tylenol he will let us know.

## 2018-11-11 NOTE — Assessment & Plan Note (Signed)
Undetermined cause.  It is not description of the discomfort.  Will refer to orthopedics.

## 2018-11-11 NOTE — Assessment & Plan Note (Signed)
I suspect the patient has constipated and having the diarrhea around the constipation.  We will treat with MiraLAX.  If not improving after about a week he will let us know.

## 2018-11-11 NOTE — Assessment & Plan Note (Addendum)
This is a chronic issue.  I suspect there may be some underlying reactive airway disease that is contributing given his prior history of Advair use and asthma.  EKG is reassuring.  Stable from prior EKG.  Will obtain a chest x-ray and lab work to evaluate further.  Consider starting medication for reactive airway disease  if evaluation is negative.

## 2018-11-16 ENCOUNTER — Telehealth: Payer: Self-pay

## 2018-11-16 DIAGNOSIS — R0602 Shortness of breath: Secondary | ICD-10-CM

## 2018-11-16 NOTE — Telephone Encounter (Signed)
Order placed for PFTs. I will forward to [facility] to get scheduled.

## 2018-11-16 NOTE — Telephone Encounter (Signed)
-----   Message from Leone Haven, MD sent at 11/14/2018 12:35 PM EDT ----- Please let the patient know his A1c is in the diabetic range. He needs to work on dietary changes for this. I have sent dietary guidelines to his mychart. He is mildly anemic. I would like to recheck this at his visit in November. I would like to perform pulmonary function testing to see if his chronic shortness of breath is related to asthma. I can order this once you speak with him. Thanks.

## 2018-12-03 ENCOUNTER — Telehealth: Payer: Self-pay | Admitting: Family Medicine

## 2018-12-03 NOTE — Telephone Encounter (Signed)
I called pt twice not able to leave vm.

## 2018-12-06 ENCOUNTER — Ambulatory Visit: Payer: Managed Care, Other (non HMO) | Admitting: Family Medicine

## 2019-01-03 ENCOUNTER — Other Ambulatory Visit: Payer: Self-pay

## 2019-01-03 ENCOUNTER — Other Ambulatory Visit
Admission: RE | Admit: 2019-01-03 | Discharge: 2019-01-03 | Disposition: A | Discharge status: Home / Self Care | Payer: Managed Care, Other (non HMO) | Source: Ambulatory Visit | Attending: Family Medicine | Admitting: Family Medicine

## 2019-01-03 DIAGNOSIS — Z20828 Contact with and (suspected) exposure to other viral communicable diseases: Secondary | ICD-10-CM | POA: Diagnosis not present

## 2019-01-03 DIAGNOSIS — Z01812 Encounter for preprocedural laboratory examination: Secondary | ICD-10-CM | POA: Diagnosis present

## 2019-01-04 ENCOUNTER — Other Ambulatory Visit: Payer: Self-pay

## 2019-01-04 ENCOUNTER — Ambulatory Visit: Payer: Managed Care, Other (non HMO) | Attending: Family Medicine

## 2019-01-04 ENCOUNTER — Encounter: Payer: Self-pay | Admitting: Family Medicine

## 2019-01-04 DIAGNOSIS — R0602 Shortness of breath: Secondary | ICD-10-CM

## 2019-01-04 LAB — SARS CORONAVIRUS 2 (TAT 6-24 HRS): SARS Coronavirus 2: NEGATIVE

## 2019-01-05 ENCOUNTER — Telehealth: Payer: Self-pay | Admitting: Family Medicine

## 2019-01-05 NOTE — Telephone Encounter (Signed)
Pt called nina back, office on other calls.  He stated that he has some mid stomach pain and nausea. He state he has no energy.  He stated it is not covid because he just took a test and it was neg.  He needs to sch an appt with a provider as soon as he can.  Please advise [phone].  He stated best time to call is in the morning

## 2019-01-05 NOTE — Telephone Encounter (Signed)
Spoke with you about this patient. Routing to you for documentation.

## 2019-01-06 NOTE — Telephone Encounter (Signed)
Called the patient to schedule to see the provider and the voicemail is not set up.  Nina,cma

## 2019-01-11 ENCOUNTER — Ambulatory Visit (INDEPENDENT_AMBULATORY_CARE_PROVIDER_SITE_OTHER): Payer: Managed Care, Other (non HMO) | Admitting: Family Medicine

## 2019-01-11 ENCOUNTER — Other Ambulatory Visit: Payer: Self-pay

## 2019-01-11 ENCOUNTER — Encounter: Payer: Self-pay | Admitting: Family Medicine

## 2019-01-11 DIAGNOSIS — K5900 Constipation, unspecified: Secondary | ICD-10-CM | POA: Diagnosis not present

## 2019-01-11 DIAGNOSIS — K219 Gastro-esophageal reflux disease without esophagitis: Secondary | ICD-10-CM | POA: Diagnosis not present

## 2019-01-11 MED ORDER — OMEPRAZOLE 20 MG PO CPDR: 20.0000 mg | DELAYED_RELEASE_CAPSULE | Freq: Every day | ORAL | 0 refills | Status: DC

## 2019-01-11 MED ORDER — DOCUSATE SODIUM 100 MG PO CAPS: 100.0000 mg | ORAL_CAPSULE | Freq: Every day | ORAL | 0 refills | Status: DC | PRN

## 2019-01-11 NOTE — Assessment & Plan Note (Signed)
The bad taste in the back of his mouth may be reflux related.  We will restart him on omeprazole and see if this improves.  If not improving over the next several days he will let us know.

## 2019-01-11 NOTE — Assessment & Plan Note (Signed)
This continues to be an issue.  We will stop MiraLAX given the bad taste in the back of his mouth.  We will trial Colace to see if that is more beneficial.  If not improving he will let us know.  Discussed reasons to seek medical attention.

## 2019-01-11 NOTE — Progress Notes (Signed)
Virtual Visit via telephone Note  This visit type was conducted due to national recommendations for restrictions regarding the COVID-19 pandemic (e.g. social distancing).  This format is felt to be most appropriate for this patient at this time.  All issues noted in this document were discussed and addressed.  No physical exam was performed (except for noted visual exam findings with Video Visits).   I connected with Ryan Wolfe today at 10:30 AM EST by telephone and verified that I am speaking with the correct person using two identifiers. Location patient: car Location provider: work  Persons participating in the virtual visit: patient, provider  I discussed the limitations, risks, security and privacy concerns of performing an evaluation and management service by telephone and the availability of in person appointments. I also discussed with the patient that there may be a patient responsible charge related to this service. The patient expressed understanding and agreed to proceed.  Interactive audio and video telecommunications were attempted between this provider and patient, however failed, due to patient having technical difficulties OR patient did not have access to video capability.  We continued and completed visit with audio only.  Reason for visit: same day visit  HPI: Constipation/reflux: Patient notes last week he developed some constipation.  He had some lower abdominal discomfort associated with this.  He took the MiraLAX and did an over-the-counter medication and notes he finally had a bowel movement and the pain improved.  He has no pain at this time.  Still not having good bowel movements.  Some mild nausea related to the reflux.  No vomiting or diarrhea.  No blood in his stool.  No fevers.  No cough, congestion, or shortness of breath.  He does report a bad taste in the back of his throat.  That started last week after taking MiraLAX.  Describes it as a mildly sour taste  though has a hard time giving much more of a description than that.  He is no longer on reflux medications.  No smell changes.  No taste loss.  No burning discomfort in his throat or chest.  No dysphagia.  Minimal sore throat though nothing significant.   ROS: See pertinent positives and negatives per HPI.  Past Medical History:  Diagnosis Date  . Arthritis   . Chicken pox   . GERD (gastroesophageal reflux disease)   . Headache     Past Surgical History:  Procedure Laterality Date  . TONSILLECTOMY AND ADENOIDECTOMY    . UPPER GI ENDOSCOPY      Family History  Problem Relation Age of Onset  . Arthritis Unknown        grandparent  . Hypertension Unknown        parent and grandparent  . Diabetes Mother     SOCIAL HX: Former smoker   Current Outpatient Medications:  .  ergocalciferol (VITAMIN D2) 50000 units capsule, Take 1 capsule (50,000 Units total) by mouth once a week., Disp: 12 capsule, Rfl: 0 .  escitalopram (LEXAPRO) 10 MG tablet, Take 1 tablet (10 mg total) by mouth daily., Disp: 30 tablet, Rfl: 3 .  ondansetron (ZOFRAN ODT) 4 MG disintegrating tablet, Take 1 tablet (4 mg total) by mouth every 8 (eight) hours as needed for nausea or vomiting., Disp: 20 tablet, Rfl: 1 .  vitamin B-12 (CYANOCOBALAMIN) 1000 MCG tablet, Take 1 tablet (1,000 mcg total) by mouth daily., Disp: 90 tablet, Rfl: 0 .  docusate sodium (COLACE) 100 MG capsule, Take 1 capsule (100 mg  total) by mouth daily as needed for mild constipation., Disp: 10 capsule, Rfl: 0 .  omeprazole (PRILOSEC) 20 MG capsule, Take 1 capsule (20 mg total) by mouth daily., Disp: 30 capsule, Rfl: 0  EXAM: This is a telehealth telephone visit and thus no physical exam was completed.  ASSESSMENT AND PLAN:  Discussed the following assessment and plan:  GERD (gastroesophageal reflux disease) The bad taste in the back of his mouth may be reflux related.  We will restart him on omeprazole and see if this improves.  If not  improving over the next several days he will let us know.  Constipation This continues to be an issue.  We will stop MiraLAX given the bad taste in the back of his mouth.  We will trial Colace to see if that is more beneficial.  If not improving he will let us know.  Discussed reasons to seek medical attention.    I discussed the assessment and treatment plan with the patient. The patient was provided an opportunity to ask questions and all were answered. The patient agreed with the plan and demonstrated an understanding of the instructions.   The patient was advised to call back or seek an in-person evaluation if the symptoms worsen or if the condition fails to improve as anticipated.  I provided 16 minutes of non-face-to-face time during this encounter.   [name], MD

## 2019-01-12 NOTE — Telephone Encounter (Signed)
Patient had a telephone visit.  Gillie Fleites,cma

## 2019-10-25 ENCOUNTER — Ambulatory Visit: Payer: Managed Care, Other (non HMO) | Admitting: Nurse Practitioner

## 2019-10-27 ENCOUNTER — Other Ambulatory Visit: Payer: Self-pay

## 2019-10-27 ENCOUNTER — Encounter: Payer: Self-pay | Admitting: Internal Medicine

## 2019-10-27 ENCOUNTER — Ambulatory Visit (INDEPENDENT_AMBULATORY_CARE_PROVIDER_SITE_OTHER): Payer: Managed Care, Other (non HMO) | Admitting: Internal Medicine

## 2019-10-27 VITALS — BP 112/70 | HR 57 | Temp 97.9°F | Ht 66.0 in | Wt 186.2 lb

## 2019-10-27 DIAGNOSIS — Z1329 Encounter for screening for other suspected endocrine disorder: Secondary | ICD-10-CM | POA: Diagnosis not present

## 2019-10-27 DIAGNOSIS — E559 Vitamin D deficiency, unspecified: Secondary | ICD-10-CM

## 2019-10-27 DIAGNOSIS — Z1159 Encounter for screening for other viral diseases: Secondary | ICD-10-CM

## 2019-10-27 DIAGNOSIS — E538 Deficiency of other specified B group vitamins: Secondary | ICD-10-CM | POA: Diagnosis not present

## 2019-10-27 DIAGNOSIS — E119 Type 2 diabetes mellitus without complications: Secondary | ICD-10-CM

## 2019-10-27 DIAGNOSIS — Z0000 Encounter for general adult medical examination without abnormal findings: Secondary | ICD-10-CM | POA: Diagnosis not present

## 2019-10-27 DIAGNOSIS — Z13818 Encounter for screening for other digestive system disorders: Secondary | ICD-10-CM

## 2019-10-27 DIAGNOSIS — Z113 Encounter for screening for infections with a predominantly sexual mode of transmission: Secondary | ICD-10-CM

## 2019-10-27 DIAGNOSIS — K219 Gastro-esophageal reflux disease without esophagitis: Secondary | ICD-10-CM

## 2019-10-27 MED ORDER — OMEPRAZOLE 40 MG PO CPDR: 40.0000 mg | DELAYED_RELEASE_CAPSULE | Freq: Every day | ORAL | 3 refills | Status: DC

## 2019-10-27 MED ORDER — CHOLECALCIFEROL 1.25 MG (50000 UT) PO CAPS: 50000.0000 [IU] | ORAL_CAPSULE | ORAL | 1 refills | Status: DC

## 2019-10-27 MED ORDER — VITAMIN B-12 1000 MCG PO TABS: 1000.0000 ug | ORAL_TABLET | Freq: Every day | ORAL | 3 refills | Status: DC

## 2019-10-27 NOTE — Patient Instructions (Addendum)
b12 1000 mcg daily  Consider allergy pill zyrtec/claritin/allegra*/xyzal* over the counter at night (your night)     Allergies, Adult An allergy is when your body's defense system (immune system) overreacts to an otherwise harmless substance (allergen) that you breathe in or eat or something that touches your skin. When you come into contact with something that you are allergic to, your immune system produces certain proteins (antibodies). These proteins cause cells to release chemicals (histamines) that trigger the symptoms of an allergic reaction. Allergies often affect the nasal passages (allergic rhinitis), eyes (allergic conjunctivitis), skin (atopic dermatitis), and stomach. Allergies can be mild or severe. Allergies cannot spread from person to person (are not contagious). They can develop at any age and may be outgrown. What increases the risk? You may be at greater risk of allergies if other people in your family have allergies. What are the signs or symptoms? Symptoms depend on what type of allergy you have. They may include:  Runny, stuffy nose.  Sneezing.  Itchy mouth, ears, or throat.  Postnasal drip.  Sore throat.  Itchy, red, watery, or puffy eyes.  Skin rash or hives.  Stomach pain.  Vomiting.  Diarrhea.  Bloating.  Wheezing or coughing. People with a severe allergy to food, medicine, or an insect bite may have a life-threatening allergic reaction (anaphylaxis). Symptoms of anaphylaxis include:  Hives.  Itching.  Flushed face.  Swollen lips, tongue, or mouth.  Tight or swollen throat.  Chest pain or tightness in the chest.  Trouble breathing or shortness of breath.  Rapid heartbeat.  Dizziness or fainting.  Vomiting.  Diarrhea.  Pain in the abdomen. How is this diagnosed? This condition is diagnosed based on:  Your symptoms.  Your family and medical history.  A physical exam. You may need to see a health care provider who  specializes in treating allergies (allergist). You may also have tests, including:  Skin tests to see which allergens are causing your symptoms, such as: ? Skin prick test. In this test, your skin is pricked with a tiny needle and exposed to small amounts of possible allergens to see if your skin reacts. ? Intradermal skin test. In this test, a small amount of allergen is injected under your skin to see if your skin reacts. ? Patch test. In this test, a small amount of allergen is placed on your skin and then your skin is covered with a bandage. Your health care provider will check your skin after a couple of days to see if a rash has developed.  Blood tests.  Challenges tests. In this test, you inhale a small amount of allergen by mouth to see if you have an allergic reaction. You may also be asked to:  Keep a food diary. A food diary is a record of all the foods and drinks you have in a day and any symptoms you experience.  Practice an elimination diet. An elimination diet involves eliminating specific foods from your diet and then adding them back in one by one to find out if a certain food causes an allergic reaction. How is this treated? Treatment for allergies depends on your symptoms. Treatment may include:  Cold compresses to soothe itching and swelling.  Eye drops.  Nasal sprays.  Using a saline spray or container (neti pot) to flush out the nose (nasal irrigation). These methods can help clear away mucus and keep the nasal passages moist.  Using a humidifier.  Oral antihistamines or other medicines to block allergic  reaction and inflammation.  Skin creams to treat rashes or itching.  Diet changes to eliminate food allergy triggers.  Repeated exposure to tiny amounts of allergens to build up a tolerance and prevent future allergic reactions (immunotherapy). These include: ? Allergy shots. ? Oral treatment. This involves taking small doses of an allergen under the tongue  (sublingual immunotherapy).  Emergency epinephrine injection (auto-injector) in case of an allergic emergency. This is a self-injectable, pre-measured medicine that must be given within the first few minutes of a serious allergic reaction. Follow these instructions at home:         Avoid known allergens whenever possible.  If you suffer from airborne allergens, wash out your nose daily. You can do this with a saline spray or a neti pot to flush out your nose (nasal irrigation).  Take over-the-counter and prescription medicines only as told by your health care provider.  Keep all follow-up visits as told by your health care provider. This is important.  If you are at risk of a severe allergic reaction (anaphylaxis), keep your auto-injector with you at all times.  If you have ever had anaphylaxis, wear a medical alert bracelet or necklace that states you have a severe allergy. Contact a health care provider if:  Your symptoms do not improve with treatment. Get help right away if:  You have symptoms of anaphylaxis, such as: ? Swollen mouth, tongue, or throat. ? Pain or tightness in your chest. ? Trouble breathing or shortness of breath. ? Dizziness or fainting. ? Severe abdominal pain, vomiting, or diarrhea. This information is not intended to replace advice given to you by your health care provider. Make sure you discuss any questions you have with your health care provider. Document Revised: 04/08/2017 Document Reviewed: 08/01/2015 Elsevier Patient Education  2020 Else[employer] Inc.  Vitamin D Deficiency Vitamin D deficiency is when your body does not have enough vitamin D. Vitamin D is important to your body for many reasons:  It helps the body absorb two important minerals--calcium and phosphorus.  It plays a role in bone health.  It may help to prevent some diseases, such as diabetes and multiple sclerosis.  It plays a role in muscle function, including heart function. If  vitamin D deficiency is severe, it can cause a condition in which your bones become soft. In adults, this condition is called osteomalacia. In children, this condition is called rickets. What are the causes? This condition may be caused by:  Not eating enough foods that contain vitamin D.  Not getting enough natural sun exposure.  Having certain digestive system diseases that make it difficult for your body to absorb vitamin D. These diseases include Crohn's disease, chronic pancreatitis, and cystic fibrosis.  Having a surgery in which a part of the stomach or a part of the small intestine is removed.  Having chronic kidney disease or liver disease. What increases the risk? You are more likely to develop this condition if you:  Are older.  Do not spend much time outdoors.  Live in a long-term care facility.  Have had broken bones.  Have weak or thin bones (osteoporosis).  Have a disease or condition that changes how the body absorbs vitamin D.  Have dark skin.  Take certain medicines, such as steroid medicines or certain seizure medicines.  Are overweight or obese. What are the signs or symptoms? In mild cases of vitamin D deficiency, there may not be any symptoms. If the condition is severe, symptoms may include:  Bone pain.  Muscle pain.  Falling often.  Broken bones caused by a minor injury. How is this diagnosed? This condition may be diagnosed with blood tests. Imaging tests such as X-rays may also be done to look for changes in the bone. How is this treated? Treatment for this condition may depend on what caused the condition. Treatment options include:  Taking vitamin D supplements. Your health care provider will suggest what dose is best for you.  Taking a calcium supplement. Your health care provider will suggest what dose is best for you. Follow these instructions at home: Eating and drinking   Eat foods that contain vitamin D. Choices  include: ? Fortified dairy products, cereals, or juices. Fortified means that vitamin D has been added to the food. Check the label on the package to see if the food is fortified. ? Fatty fish, such as salmon or trout. ? Eggs. ? Oysters. ? Mushrooms. The items listed above may not be a complete list of recommended foods and beverages. Contact a dietitian for more information. General instructions  Take medicines and supplements only as told by your health care provider.  Get regular, safe exposure to natural sunlight.  Do not use a tanning bed.  Maintain a healthy weight. Lose weight if needed.  Keep all follow-up visits as told by your health care provider. This is important. How is this prevented? You can get vitamin D by:  Eating foods that naturally contain vitamin D.  Eating or drinking products that have been fortified with vitamin D, such as cereals, juices, and dairy products (including milk).  Taking a vitamin D supplement or a multivitamin supplement that contains vitamin D.  Being in the sun. Your body naturally makes vitamin D when your skin is exposed to sunlight. Your body changes the sunlight into a form of the vitamin that it can use. Contact a health care provider if:  Your symptoms do not go away.  You feel nauseous or you vomit.  You have fewer bowel movements than usual or are constipated. Summary  Vitamin D deficiency is when your body does not have enough vitamin D.  Vitamin D is important to your body for good bone health and muscle function, and it may help prevent some diseases.  Vitamin D deficiency is primarily treated through supplementation. Your health care provider will suggest what dose is best for you.  You can get vitamin D by eating foods that contain vitamin D, by being in the sun, and by taking a vitamin D supplement or a multivitamin supplement that contains vitamin D. This information is not intended to replace advice given to you by  your health care provider. Make sure you discuss any questions you have with your health care provider. Document Revised: 09/21/2017 Document Reviewed: 09/21/2017 Elsevier Patient Education  2020 Else[employer] Inc.  Vitamin B12 Deficiency Vitamin B12 deficiency occurs when the body does not have enough vitamin B12, which is an important vitamin. The body needs this vitamin:  To make red blood cells.  To make DNA. This is the genetic material inside cells.  To help the nerves work properly so they can carry messages from the brain to the body. Vitamin B12 deficiency can cause various health problems, such as a low red blood cell count (anemia) or nerve damage. What are the causes? This condition may be caused by:  Not eating enough foods that contain vitamin B12.  Not having enough stomach acid and digestive fluids to properly absorb vitamin  B12 from the food that you eat.  Certain digestive system diseases that make it hard to absorb vitamin B12. These diseases include Crohn's disease, chronic pancreatitis, and cystic fibrosis.  A condition in which the body does not make enough of a protein (intrinsic factor), resulting in too few red blood cells (pernicious anemia).  Having a surgery in which part of the stomach or small intestine is removed.  Taking certain medicines that make it hard for the body to absorb vitamin B12. These medicines include: ? Heartburn medicines (antacids and proton pump inhibitors). ? Certain antibiotic medicines. ? Some medicines that are used to treat diabetes, tuberculosis, gout, or high cholesterol. What increases the risk? The following factors may make you more likely to develop a B12 deficiency:  Being older than age 25.  Eating a vegetarian or vegan diet, especially while you are pregnant.  Eating a poor diet while you are pregnant.  Taking certain medicines.  Having alcoholism. What are the signs or symptoms? In some cases, there are no  symptoms of this condition. If the condition leads to anemia or nerve damage, various symptoms can occur, such as:  Weakness.  Fatigue.  Loss of appetite.  Weight loss.  Numbness or tingling in your hands and feet.  Redness and burning of the tongue.  Confusion or memory problems.  Depression.  Sensory problems, such as color blindness, ringing in the ears, or loss of taste.  Diarrhea or constipation.  Trouble walking. If anemia is severe, symptoms can include:  Shortness of breath.  Dizziness.  Rapid heart rate (tachycardia). How is this diagnosed? This condition may be diagnosed with a blood test to measure the level of vitamin B12 in your blood. You may also have other tests, including:  A group of tests that measure certain characteristics of blood cells (complete blood count, CBC).  A blood test to measure intrinsic factor.  A procedure where a thin tube with a camera on the end is used to look into your stomach or intestines (endoscopy). Other tests may be needed to discover the cause of B12 deficiency. How is this treated? Treatment for this condition depends on the cause. This condition may be treated by:  Changing your eating and drinking habits, such as: ? Eating more foods that contain vitamin B12. ? Drinking less alcohol or no alcohol.  Getting vitamin B12 injections.  Taking vitamin B12 supplements. Your health care provider will tell you which dosage is best for you. Follow these instructions at home: Eating and drinking   Eat lots of healthy foods that contain vitamin B12, including: ? Meats and poultry. This includes beef, pork, chicken, [place], and organ meats, such as liver. ? Seafood. This includes clams, rainbow trout, salmon, tuna, and haddock. ? Eggs. ? Cereal and dairy products that are fortified. This means that vitamin B12 has been added to the food. Check the label on the package to see if the food is fortified. The items listed  above may not be a complete list of recommended foods and beverages. Contact a dietitian for more information. General instructions  Get any injections that are prescribed by your health care provider.  Take supplements only as told by your health care provider. Follow the directions carefully.  Do not drink alcohol if your health care provider tells you not to. In some cases, you may only be asked to limit alcohol use.  Keep all follow-up visits as told by your health care provider. This is important. Contact a  health care provider if:  Your symptoms come back. Get help right away if you:  Develop shortness of breath.  Have a rapid heart rate.  Have chest pain.  Become dizzy or lose consciousness. Summary  Vitamin B12 deficiency occurs when the body does not have enough vitamin B12.  The main causes of vitamin B12 deficiency include dietary deficiency, digestive diseases, pernicious anemia, and having a surgery in which part of the stomach or small intestine is removed.  In some cases, there are no symptoms of this condition. If the condition leads to anemia or nerve damage, various symptoms can occur, such as weakness, shortness of breath, and numbness.  Treatment may include getting vitamin B12 injections or taking vitamin B12 supplements. Eat lots of healthy foods that contain vitamin B12. This information is not intended to replace advice given to you by your health care provider. Make sure you discuss any questions you have with your health care provider. Document Revised: 07/02/2018 Document Reviewed: 09/22/2017 Elsevier Patient Education  2020 Elsevier Inc.  Diabetes Mellitus and Nutrition, Adult When you have diabetes (diabetes mellitus), it is very important to have healthy eating habits because your blood sugar (glucose) levels are greatly affected by what you eat and drink. Eating healthy foods in the appropriate amounts, at about the same times every day, can help  you:  Control your blood glucose.  Lower your risk of heart disease.  Improve your blood pressure.  Reach or maintain a healthy weight. Every person with diabetes is different, and each person has different needs for a meal plan. Your health care provider may recommend that you work with a diet and nutrition specialist (dietitian) to make a meal plan that is best for you. Your meal plan may vary depending on factors such as:  The calories you need.  The medicines you take.  Your weight.  Your blood glucose, blood pressure, and cholesterol levels.  Your activity level.  Other health conditions you have, such as heart or kidney disease. How do carbohydrates affect me? Carbohydrates, also called carbs, affect your blood glucose level more than any other type of food. Eating carbs naturally raises the amount of glucose in your blood. Carb counting is a method for keeping track of how many carbs you eat. Counting carbs is important to keep your blood glucose at a healthy level, especially if you use insulin or take certain oral diabetes medicines. It is important to know how many carbs you can safely have in each meal. This is different for every person. Your dietitian can help you calculate how many carbs you should have at each meal and for each snack. Foods that contain carbs include:  Bread, cereal, rice, pasta, and crackers.  Potatoes and corn.  Peas, beans, and lentils.  Milk and yogurt.  Fruit and juice.  Desserts, such as cakes, cookies, ice cream, and candy. How does alcohol affect me? Alcohol can cause a sudden decrease in blood glucose (hypoglycemia), especially if you use insulin or take certain oral diabetes medicines. Hypoglycemia can be a life-threatening condition. Symptoms of hypoglycemia (sleepiness, dizziness, and confusion) are similar to symptoms of having too much alcohol. If your health care provider says that alcohol is safe for you, follow these  guidelines:  Limit alcohol intake to no more than 1 drink per day for nonpregnant women and 2 drinks per day for men. One drink equals 12 oz of beer, 5 oz of wine, or 1 oz of hard liquor.  Do not  drink on an empty stomach.  Keep yourself hydrated with water, diet soda, or unsweetened iced tea.  Keep in mind that regular soda, juice, and other mixers may contain a lot of sugar and must be counted as carbs. What are tips for following this plan?  Reading food labels  Start by checking the serving size on the "Nutrition Facts" label of packaged foods and drinks. The amount of calories, carbs, fats, and other nutrients listed on the label is based on one serving of the item. Many items contain more than one serving per package.  Check the total grams (g) of carbs in one serving. You can calculate the number of servings of carbs in one serving by dividing the total carbs by 15. For example, if a food has 30 g of total carbs, it would be equal to 2 servings of carbs.  Check the number of grams (g) of saturated and trans fats in one serving. Choose foods that have low or no amount of these fats.  Check the number of milligrams (mg) of salt (sodium) in one serving. Most people should limit total sodium intake to less than 2,300 mg per day.  Always check the nutrition information of foods labeled as "low-fat" or "nonfat". These foods may be higher in added sugar or refined carbs and should be avoided.  Talk to your dietitian to identify your daily goals for nutrients listed on the label. Shopping  Avoid buying canned, premade, or processed foods. These foods tend to be high in fat, sodium, and added sugar.  Shop around the outside edge of the grocery store. This includes fresh fruits and vegetables, bulk grains, fresh meats, and fresh dairy. Cooking  Use low-heat cooking methods, such as baking, instead of high-heat cooking methods like deep frying.  Cook using healthy oils, such as olive,  canola, or sunflower oil.  Avoid cooking with butter, cream, or high-fat meats. Meal planning  Eat meals and snacks regularly, preferably at the same times every day. Avoid going long periods of time without eating.  Eat foods high in fiber, such as fresh fruits, vegetables, beans, and whole grains. Talk to your dietitian about how many servings of carbs you can eat at each meal.  Eat 4-6 ounces (oz) of lean protein each day, such as lean meat, chicken, fish, eggs, or tofu. One oz of lean protein is equal to: ? 1 oz of meat, chicken, or fish. ? 1 egg. ?  cup of tofu.  Eat some foods each day that contain healthy fats, such as avocado, nuts, seeds, and fish. Lifestyle  Check your blood glucose regularly.  Exercise regularly as told by your health care provider. This may include: ? 150 minutes of moderate-intensity or vigorous-intensity exercise each week. This could be brisk walking, biking, or water aerobics. ? Stretching and doing strength exercises, such as yoga or weightlifting, at least 2 times a week.  Take medicines as told by your health care provider.  Do not use any products that contain nicotine or tobacco, such as cigarettes and e-cigarettes. If you need help quitting, ask your health care provider.  Work with a Veterinary surgeoncounselor or diabetes educator to identify strategies to manage stress and any emotional and social challenges. Questions to ask a health care provider  Do I need to meet with a diabetes educator?  Do I need to meet with a dietitian?  What number can I call if I have questions?  When are the best times to check my blood  glucose? Where to find more information:  American Diabetes Association: diabetes.org  Academy of Nutrition and Dietetics: www.eatright.[employer] of Diabetes and Digestive and Kidney Diseases (NIH): CarFlippers.tn Summary  A healthy meal plan will help you control your blood glucose and maintain a healthy  lifestyle.  Working with a diet and nutrition specialist (dietitian) can help you make a meal plan that is best for you.  Keep in mind that carbohydrates (carbs) and alcohol have immediate effects on your blood glucose levels. It is important to count carbs and to use alcohol carefully. This information is not intended to replace advice given to you by your health care provider. Make sure you discuss any questions you have with your health care provider. Document Revised: 12/26/2016 Document Reviewed: 02/18/2016 Elsevier Patient Education  2020 [employer].   Food Choices for Gastroesophageal Reflux Disease, Adult When you have gastroesophageal reflux disease (GERD), the foods you eat and your eating habits are very important. Choosing the right foods can help ease the discomfort of GERD. Consider working with a diet and nutrition specialist (dietitian) to help you make healthy food choices. What general guidelines should I follow?  Eating plan  Choose healthy foods low in fat, such as fruits, vegetables, whole grains, low-fat dairy products, and lean meat, fish, and poultry.  Eat frequent, small meals instead of three large meals each day. Eat your meals slowly, in a relaxed setting. Avoid bending over or lying down until 2-3 hours after eating.  Limit high-fat foods such as fatty meats or fried foods.  Limit your intake of oils, butter, and shortening to less than 8 teaspoons each day.  Avoid the following: ? Foods that cause symptoms. These may be different for different people. Keep a food diary to keep track of foods that cause symptoms. ? Alcohol. ? Drinking large amounts of liquid with meals. ? Eating meals during the 2-3 hours before bed.  Cook foods using methods other than frying. This may include baking, grilling, or broiling. Lifestyle  Maintain a healthy weight. Ask your health care provider what weight is healthy for you. If you need to lose weight, work with your  health care provider to do so safely.  Exercise for at least 30 minutes on 5 or more days each week, or as told by your health care provider.  Avoid wearing clothes that fit tightly around your waist and chest.  Do not use any products that contain nicotine or tobacco, such as cigarettes and e-cigarettes. If you need help quitting, ask your health care provider.  Sleep with the head of your bed raised. Use a wedge under the mattress or blocks under the bed frame to raise the head of the bed. What foods are not recommended? The items listed may not be a complete list. Talk with your dietitian about what dietary choices are best for you. Grains Pastries or quick breads with added fat. [place] toast. Vegetables Deep fried vegetables. [place] fries. Any vegetables prepared with added fat. Any vegetables that cause symptoms. For some people this may include tomatoes and tomato products, chili peppers, onions and garlic, and horseradish. Fruits Any fruits prepared with added fat. Any fruits that cause symptoms. For some people this may include citrus fruits, such as oranges, grapefruit, pineapple, and lemons. Meats and other protein foods High-fat meats, such as fatty beef or pork, hot dogs, ribs, ham, sausage, salami and bacon. Fried meat or protein, including fried fish and fried chicken. Nuts and nut butters. Dairy  Whole milk and chocolate milk. Sour cream. Cream. Ice cream. Cream cheese. Milk shakes. Beverages Coffee and tea, with or without caffeine. Carbonated beverages. Sodas. Energy drinks. Fruit juice made with acidic fruits (such as orange or grapefruit). Tomato juice. Alcoholic drinks. Fats and oils Butter. Margarine. Shortening. Ghee. Sweets and desserts Chocolate and cocoa. Donuts. Seasoning and other foods Pepper. Peppermint and spearmint. Any condiments, herbs, or seasonings that cause symptoms. For some people, this may include curry, hot sauce, or vinegar-based salad  dressings. Summary  When you have gastroesophageal reflux disease (GERD), food and lifestyle choices are very important to help ease the discomfort of GERD.  Eat frequent, small meals instead of three large meals each day. Eat your meals slowly, in a relaxed setting. Avoid bending over or lying down until 2-3 hours after eating.  Limit high-fat foods such as fatty meat or fried foods. This information is not intended to replace advice given to you by your health care provider. Make sure you discuss any questions you have with your health care provider. Document Revised: 05/06/2018 Document Reviewed: 01/15/2016 Elsevier Patient Education  2020 [employer].

## 2019-10-27 NOTE — Addendum Note (Signed)
Addended by: Warden Fillers on: 10/27/2019 03:37 PM   Modules accepted: Orders

## 2019-10-27 NOTE — Progress Notes (Signed)
Chief Complaint  Patient presents with  . Labs Only   F/u  1. DM 2 A1C 6.8 2020 dentist needs to know labs for procedure upcoming  2. C/o cold feet since covid 19 shot 10/22/19 cold on inside not outside reviewed this is not side effect, denies weakness numbness tingling 3. H/o wheezing and increased mucous production at times not sure if has allergies but thinks so and h/o bronchitis and being on Advair which helped in the past pfts 10/2018 negative and CXR 10/2018 negative    Review of Systems  Constitutional: Positive for malaise/fatigue.  HENT: Negative for hearing loss.   Eyes: Negative for blurred vision.  Respiratory: Negative for shortness of breath.   Cardiovascular: Negative for chest pain.  Gastrointestinal: Negative for abdominal pain.  Musculoskeletal: Negative for falls.  Skin: Negative for rash.  Psychiatric/Behavioral: Negative for depression.   Past Medical History:  Diagnosis Date  . Arthritis   . Chicken pox   . GERD (gastroesophageal reflux disease)   . Headache    Past Surgical History:  Procedure Laterality Date  . TONSILLECTOMY AND ADENOIDECTOMY    . UPPER GI ENDOSCOPY     Family History  Problem Relation Age of Onset  . Arthritis Unknown        grandparent  . Hypertension Unknown        parent and grandparent  . Diabetes Mother    Social History   Socioeconomic History  . Marital status: Married    Spouse name: Not on file  . Number of children: Not on file  . Years of education: Not on file  . Highest education level: Not on file  Occupational History  . Not on file  Tobacco Use  . Smoking status: Former Games developer  . Smokeless tobacco: Never Used  Vaping Use  . Vaping Use: Never used  Substance and Sexual Activity  . Alcohol use: Yes    Alcohol/week: 5.0 standard drinks    Types: 5 Standard drinks or equivalent per week  . Drug use: No  . Sexual activity: Not on file  Other Topics Concern  . Not on file  Social History Narrative  .  Not on file   Social Determinants of Health   Financial Resource Strain:   . Difficulty of Paying Living Expenses: Not on file  Food Insecurity:   . Worried About Programme researcher, broadcasting/film/video in the Last Year: Not on file  . Ran Out of Food in the Last Year: Not on file  Transportation Needs:   . Lack of Transportation (Medical): Not on file  . Lack of Transportation (Non-Medical): Not on file  Physical Activity:   . Days of Exercise per Week: Not on file  . Minutes of Exercise per Session: Not on file  Stress:   . Feeling of Stress : Not on file  Social Connections:   . Frequency of Communication with Friends and Family: Not on file  . Frequency of Social Gatherings with Friends and Family: Not on file  . Attends Religious Services: Not on file  . Active Member of Clubs or Organizations: Not on file  . Attends Banker Meetings: Not on file  . Marital Status: Not on file  Intimate Partner Violence:   . Fear of Current or Ex-Partner: Not on file  . Emotionally Abused: Not on file  . Physically Abused: Not on file  . Sexually Abused: Not on file   No outpatient medications have been marked as taking for  the 10/27/19 encounter (Office Visit) with McLean-Scocuzza, Pasty Spillers, MD.   No Known Allergies No results found for this or any previous visit (from the past 2160 hour(s)). Objective  Body mass index is 30.05 kg/m. Wt Readings from Last 3 Encounters:  10/27/19 186 lb 3.2 oz (84.5 kg)  01/11/19 188 lb (85.3 kg)  11/09/18 190 lb 6.4 oz (86.4 kg)   Temp Readings from Last 3 Encounters:  10/27/19 97.9 F (36.6 C) (Oral)  11/09/18 (!) 97.4 F (36.3 C) (Temporal)  10/14/17 97.9 F (36.6 C) (Oral)   BP Readings from Last 3 Encounters:  10/27/19 112/70  11/09/18 140/80  10/14/17 138/86   Pulse Readings from Last 3 Encounters:  10/27/19 (!) 57  11/09/18 (!) 59  10/14/17 (!) 55    Physical Exam Vitals and nursing note reviewed.  Constitutional:      Appearance:  Normal appearance. He is well-developed and well-groomed.  HENT:     Head: Normocephalic and atraumatic.  Eyes:     Conjunctiva/sclera: Conjunctivae normal.     Pupils: Pupils are equal, round, and reactive to light.  Cardiovascular:     Rate and Rhythm: Normal rate and regular rhythm.     Heart sounds: Normal heart sounds. No murmur heard.   Pulmonary:     Effort: Pulmonary effort is normal.     Breath sounds: Normal breath sounds.  Skin:    General: Skin is warm and dry.  Neurological:     General: No focal deficit present.     Mental Status: He is alert and oriented to person, place, and time. Mental status is at baseline.     Gait: Gait normal.  Psychiatric:        Attention and Perception: Attention and perception normal.        Mood and Affect: Mood and affect normal.        Speech: Speech normal.        Behavior: Behavior normal. Behavior is cooperative.        Thought Content: Thought content normal.        Cognition and Memory: Cognition and memory normal.        Judgment: Judgment normal.     Assessment  Plan  Type 2 diabetes mellitus without complication, without long-term current use of insulin (HCC) - Plan: Comprehensive metabolic panel, Lipid panel, Hemoglobin A1c, Urinalysis, Routine w reflex microscopic, Microalbumin / creatinine urine ratio Foot exam today  Declines nutrition for now given nutrition info Consider eye exam in future  F/u PCP 11/2019 further management    B12 deficiency - Plan: Vitamin B12, vitamin B-12 (CYANOCOBALAMIN) 1000 MCG tablet  Vitamin D deficiency - Plan: Vitamin D (25 hydroxy), Cholecalciferol 1.25 MG (50000 UT) capsule  Gastroesophageal reflux disease without esophagitis - Plan: omeprazole (PRILOSEC) 40 MG capsule increased from 20 as having sxs on 20 mg dose   HM Declines flu  1/2 pfizer 10/22/19  Provider: Dr. French Ana McLean-Scocuzza-[facility]

## 2019-10-28 LAB — COMPREHENSIVE METABOLIC PANEL
ALT: 24 U/L (ref 0–53)
AST: 18 U/L (ref 0–37)
Albumin: 4.5 g/dL (ref 3.5–5.2)
Alkaline Phosphatase: 33 U/L — ABNORMAL LOW (ref 39–117)
BUN: 12 mg/dL (ref 6–23)
CO2: 31 mEq/L (ref 19–32)
Calcium: 9 mg/dL (ref 8.4–10.5)
Chloride: 102 mEq/L (ref 96–112)
Creatinine, Ser: 0.69 mg/dL (ref 0.40–1.50)
GFR: 153.01 mL/min (ref >60.00)
Glucose, Bld: 96 mg/dL (ref 70–99)
Potassium: 3.6 mEq/L (ref 3.5–5.1)
Sodium: 140 mEq/L (ref 135–145)
Total Bilirubin: 0.5 mg/dL (ref 0.2–1.2)
Total Protein: 7 g/dL (ref 6.0–8.3)

## 2019-10-28 LAB — VITAMIN B12: Vitamin B-12: 226 pg/mL (ref 211–911)

## 2019-10-28 LAB — URINALYSIS, ROUTINE W REFLEX MICROSCOPIC
Bilirubin Urine: NEGATIVE
Glucose, UA: NEGATIVE
Hgb urine dipstick: NEGATIVE
Ketones, ur: NEGATIVE
Leukocytes,Ua: NEGATIVE
Nitrite: NEGATIVE
Protein, ur: NEGATIVE
Specific Gravity, Urine: 1.025 (ref 1.001–1.03)
pH: 5.5 (ref 5.0–8.0)

## 2019-10-28 LAB — CBC WITH DIFFERENTIAL/PLATELET
Basophils Absolute: 0 10*3/uL (ref 0.0–0.1)
Basophils Relative: 1.1 % (ref 0.0–3.0)
Eosinophils Absolute: 0.1 10*3/uL (ref 0.0–0.7)
Eosinophils Relative: 1.6 % (ref 0.0–5.0)
HCT: 37.6 % — ABNORMAL LOW (ref 39.0–52.0)
Hemoglobin: 12.4 g/dL — ABNORMAL LOW (ref 13.0–17.0)
Lymphocytes Relative: 51.1 % — ABNORMAL HIGH (ref 12.0–46.0)
Lymphs Abs: 2.3 10*3/uL (ref 0.7–4.0)
MCHC: 32.9 g/dL (ref 30.0–36.0)
MCV: 81.8 fl (ref 78.0–100.0)
Monocytes Absolute: 0.5 10*3/uL (ref 0.1–1.0)
Monocytes Relative: 10.3 % (ref 3.0–12.0)
Neutro Abs: 1.6 10*3/uL (ref 1.4–7.7)
Neutrophils Relative %: 35.9 % — ABNORMAL LOW (ref 43.0–77.0)
Platelets: 225 10*3/uL (ref 150.0–400.0)
RBC: 4.59 Mil/uL (ref 4.22–5.81)
RDW: 14 % (ref 11.5–15.5)
WBC: 4.5 10*3/uL (ref 4.0–10.5)

## 2019-10-28 LAB — TSH: TSH: 0.79 u[IU]/mL (ref 0.35–4.50)

## 2019-10-28 LAB — LIPID PANEL
Cholesterol: 136 mg/dL (ref 0–200)
HDL: 33.4 mg/dL — ABNORMAL LOW (ref >39.00)
LDL Cholesterol: 84 mg/dL (ref 0–99)
NonHDL: 102.72
Total CHOL/HDL Ratio: 4
Triglycerides: 95 mg/dL (ref 0.0–149.0)
VLDL: 19 mg/dL (ref 0.0–40.0)

## 2019-10-28 LAB — VITAMIN D 25 HYDROXY (VIT D DEFICIENCY, FRACTURES): VITD: 12.56 ng/mL — ABNORMAL LOW (ref 30.00–100.00)

## 2019-10-28 LAB — MICROALBUMIN / CREATININE URINE RATIO
Creatinine, Urine: 218 mg/dL (ref 20–320)
Microalb Creat Ratio: 3 mcg/mg creat (ref <30)
Microalb, Ur: 0.7 mg/dL

## 2019-10-28 LAB — HEMOGLOBIN A1C: Hgb A1c MFr Bld: 6.6 % — ABNORMAL HIGH (ref 4.6–6.5)

## 2019-10-28 LAB — T4, FREE: Free T4: 0.82 ng/dL (ref 0.60–1.60)

## 2019-10-31 LAB — HIV ANTIBODY (ROUTINE TESTING W REFLEX): HIV 1&2 Ab, 4th Generation: NONREACTIVE

## 2019-10-31 LAB — HEPATITIS C ANTIBODY
Hepatitis C Ab: NONREACTIVE
SIGNAL TO CUT-OFF: 0.2 (ref <1.00)

## 2019-12-05 ENCOUNTER — Other Ambulatory Visit: Payer: Self-pay

## 2019-12-05 ENCOUNTER — Ambulatory Visit (INDEPENDENT_AMBULATORY_CARE_PROVIDER_SITE_OTHER): Payer: Managed Care, Other (non HMO) | Admitting: Family Medicine

## 2019-12-05 ENCOUNTER — Encounter: Payer: Self-pay | Admitting: Family Medicine

## 2019-12-05 VITALS — BP 128/76 | HR 68 | Temp 98.3°F | Ht 66.0 in | Wt 190.0 lb

## 2019-12-05 DIAGNOSIS — Z23 Encounter for immunization: Secondary | ICD-10-CM | POA: Diagnosis not present

## 2019-12-05 DIAGNOSIS — K219 Gastro-esophageal reflux disease without esophagitis: Secondary | ICD-10-CM

## 2019-12-05 DIAGNOSIS — E119 Type 2 diabetes mellitus without complications: Secondary | ICD-10-CM | POA: Diagnosis not present

## 2019-12-05 DIAGNOSIS — Z0001 Encounter for general adult medical examination with abnormal findings: Secondary | ICD-10-CM

## 2019-12-05 DIAGNOSIS — N529 Male erectile dysfunction, unspecified: Secondary | ICD-10-CM | POA: Insufficient documentation

## 2019-12-05 DIAGNOSIS — Z125 Encounter for screening for malignant neoplasm of prostate: Secondary | ICD-10-CM

## 2019-12-05 MED ORDER — PANTOPRAZOLE SODIUM 40 MG PO TBEC: 40.0000 mg | DELAYED_RELEASE_TABLET | Freq: Every day | ORAL | 3 refills | Status: DC

## 2019-12-05 MED ORDER — ROSUVASTATIN CALCIUM 20 MG PO TABS: 20.0000 mg | ORAL_TABLET | Freq: Every day | ORAL | 3 refills | Status: DC

## 2019-12-05 MED ORDER — METFORMIN HCL ER 500 MG PO TB24: 500.0000 mg | ORAL_TABLET | Freq: Every day | ORAL | 1 refills | Status: DC

## 2019-12-05 MED ORDER — SILDENAFIL CITRATE 20 MG PO TABS: 20.0000 mg | ORAL_TABLET | Freq: Every day | ORAL | 0 refills | Status: DC | PRN

## 2019-12-05 NOTE — Progress Notes (Signed)
Ryan Alar, MD Phone: [phone]  Ryan Wolfe is a 39 y.o. male who presents today for CPE.  Diet: relatively unhealthy per patient report. No soda or sweet tea.  Exercise: walks and rides bikes most days Colonoscopy: not indicated Prostate cancer screening: due Family history-  Prostate cancer: no  Colon cancer: no Vaccines-   Flu: declines  Tetanus: due  COVID19: UTD HIV screening: completed Hep C Screening: completed Tobacco use: 1-2 cigarettes per month Alcohol use: drinks up to 8 beverages on Saturday and sunday Illicit Drug use: no Dentist: yes Ophthalmology: yes  Erectile dysfunction: Patient notes decreased desire for intercourse though also notes difficulty getting a full erection and maintaining an erection.  Trouble ejaculating as well given lack of erections.  No pain with erections.  Has only been occurring this year.  Reflux: Patient notes no significant reflux though notes some nausea after he has been off of his omeprazole.  He notes his insurance would not cover this.   Active Ambulatory Problems    Diagnosis Date Noted  . Chest pain 10/27/2014  . Abdominal pain 10/27/2014  . Headache 10/27/2014  . Low back pain 10/27/2014  . Neck pain 11/08/2014  . Type 2 diabetes mellitus without complications (HCC) 11/08/2014  . GERD (gastroesophageal reflux disease) 12/24/2016  . Sinusitis 12/24/2016  . Fatigue 10/14/2017  . Abdominal fullness 10/14/2017  . Dyspnea on exertion 11/11/2018  . Chronic pain of left knee 11/11/2018  . Constipation 11/11/2018  . Anxiety 11/11/2018  . Encounter for general adult medical examination with abnormal findings 12/05/2019  . 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 No Resolved Ambulatory Problems   Past Medical History:  Diagnosis Date  . Arthritis   . Chicken pox     Family History  Problem Relation Age of Onset  . Arthritis Unknown         grandparent  . Hypertension Unknown        parent and grandparent  . Diabetes Mother     Social History   Socioeconomic History  . Marital status: Married    Spouse name: Not on file  . Number of children: Not on file  . Years of education: Not on file  . Highest education level: Not on file  Occupational History  . Not on file  Tobacco Use  . Smoking status: Former Games developer  . Smokeless tobacco: Never Used  Vaping Use  . Vaping Use: Never used  Substance and Sexual Activity  . Alcohol use: Yes    Alcohol/week: 5.0 standard drinks    Types: 5 Standard drinks or equivalent per week  . Drug use: No  . Sexual activity: Not on file  Other Topics Concern  . Not on file  Social History Narrative  . Not on file   Social Determinants of Health   Financial Resource Strain:   . Difficulty of Paying Living Expenses: Not on file  Food Insecurity:   . Worried About Programme researcher, broadcasting/film/video in the Last Year: Not on file  . Ran Out of Food in the Last Year: Not on file  Transportation Needs:   . Lack of Transportation (Medical): Not on file  . Lack of Transportation (Non-Medical): Not on file  Physical Activity:   . Days of Exercise per Week: Not on file  . Minutes of Exercise per Session: Not on file  Stress:   . Feeling of Stress : Not on file  Social Connections:   . Frequency of Communication with Friends and Family: Not on file  . Frequency of Social Gatherings with Friends and Family: Not on file  . Attends Religious Services: Not on file  . Active Member of Clubs or Organizations: Not on file  . Attends Banker Meetings: Not on file  . Marital Status: Not on file  Intimate Partner Violence:   . Fear of Current or Ex-Partner: Not on file  . Emotionally Abused: Not on file  . Physically Abused: Not on file  . Sexually Abused: Not on file    ROS  General:  Negative for nexplained weight loss, fever Skin: Negative for new or changing mole, sore that won't  heal HEENT: Negative for trouble hearing, trouble seeing, ringing in ears, mouth sores, hoarseness, change in voice, dysphagia. CV:  Negative for chest pain, dyspnea, edema, palpitations Resp: Negative for cough, dyspnea, hemoptysis GI: Positive for nausea, negative for vomiting, diarrhea, constipation, abdominal pain, melena, hematochezia. GU: Positive for sexual difficulty, negative for dysuria, incontinence, urinary hesitance, hematuria, vaginal or penile discharge, polyuria, lumps in testicle or breasts MSK: Negative for muscle cramps or aches, joint pain or swelling Neuro: Negative for headaches, weakness, numbness, dizziness, passing out/fainting Psych: Negative for depression, anxiety, memory problems  Objective  Physical Exam Vitals:   12/05/19 1425  BP: 128/76  Pulse: 68  Temp: 98.3 F (36.8 C)  SpO2: 97%    BP Readings from Last 3 Encounters:  12/05/19 128/76  10/27/19 112/70  11/09/18 140/80   Wt Readings from Last 3 Encounters:  12/05/19 190 lb (86.2 kg)  10/27/19 186 lb 3.2 oz (84.5 kg)  01/11/19 188 lb (85.3 kg)    Physical Exam Constitutional:      General: He is not in acute distress.    Appearance: He is not diaphoretic.  HENT:     Head: Normocephalic and atraumatic.  Eyes:     Conjunctiva/sclera: Conjunctivae normal.     Pupils: Pupils are equal, round, and reactive to light.  Cardiovascular:     Rate and Rhythm: Normal rate and regular rhythm.     Heart sounds: Normal heart sounds.  Pulmonary:     Effort: Pulmonary effort is normal.     Breath sounds: Normal breath sounds.  Abdominal:     General: Bowel sounds are normal. There is no distension.     Palpations: Abdomen is soft.     Tenderness: There is no abdominal tenderness. There is no guarding or rebound.  Genitourinary:    Comments: Normal circumcised penis, normal scrotum, normal testicles Musculoskeletal:     Right lower leg: No edema.     Left lower leg: No edema.  Lymphadenopathy:       Cervical: No cervical adenopathy.  Skin:    General: Skin is warm and dry.  Neurological:     Mental Status: He is alert.  Psychiatric:        Mood and Affect: Mood normal.      Assessment/Plan:   Problem List Items Addressed This Visit    Encounter for general adult medical examination with abnormal findings - Primary    Physical exam completed.  Discussed healthy diet.  Encouraged continued exercise.  PSA for prostate cancer screening completed.  Tetanus vaccine given.  Patient declines flu vaccine.  Advised to quit smoking.  Discussed decreasing alcohol intake.  Prior lab work reviewed.      GERD (gastroesophageal reflux disease)    Patient with recurrent nausea with  stopping his omeprazole.  We will see if his insurance covers Protonix 40 mg once daily.      Relevant Medications   pantoprazole (PROTONIX) 40 MG tablet   Type 2 diabetes mellitus without complications (HCC)    Patient with 2 A1c is in the diabetic range.  Discussed dietary changes and exercise.  Will start on Metformin XR 500 mg once daily.  He will follow up in 3 months.  He will let us know if he has any GI side effects with the Metformin.  The patient also meets criteria for treatment with a statin.  We will start on Crestor 20 mg once daily.  Follow-up labs in 6 weeks.      Relevant Medications   rosuvastatin (CRESTOR) 20 MG tablet   metFORMIN (GLUCOPHAGE-XR) 500 MG 24 hr tablet   Other Relevant Orders   Hepatic function panel   LDL cholesterol, direct   Vasculogenic erectile dysfunction    We will give the patient a trial of sildenafil.  Advised he could titrate the dose up if needed.  He will start with 20 mg once daily as needed he can increase to 40 mg or 60 mg once daily as needed.  Advised to seek medical attention if he develops chest pain or shortness of breath during intercourse.  Advised to let EMS or the ED know that he has taken this medication if he ever has to be evaluated.      Relevant  Medications   sildenafil (REVATIO) 20 MG tablet    Other Visit Diagnoses    Prostate cancer screening       Relevant Orders   PSA      This visit occurred during the SARS-CoV-2 public health emergency.  Safety protocols were in place, including screening questions prior to the visit, additional usage of staff PPE, and extensive cleaning of exam room while observing appropriate contact time as indicated for disinfecting solutions.    Ryan Alar, MD [facility]

## 2019-12-05 NOTE — Patient Instructions (Signed)
Nice to see you. We will start you on Metformin and Crestor.  The Metformin is for diabetes.  The Crestor is for cholesterol.  If you have any muscle aches or stomach upset with these things please let us know. Please try the sildenafil for erectile dysfunction.  You can start by trying 1 tablet at a time as needed and if that is not beneficial you can go to 2 tablets at a time as needed and if that is not beneficial you can go to 3 tablets at a time as needed.  Do not take this medication more than once a day.

## 2019-12-06 LAB — PSA: PSA: 0.83 ng/mL (ref 0.10–4.00)

## 2019-12-06 NOTE — Assessment & Plan Note (Signed)
Patient with recurrent nausea with stopping his omeprazole.  We will see if his insurance covers Protonix 40 mg once daily.

## 2019-12-06 NOTE — Assessment & Plan Note (Addendum)
Patient with 2 A1c is in the diabetic range.  Discussed dietary changes and exercise.  Will start on Metformin XR 500 mg once daily.  He will follow up in 3 months.  He will let us know if he has any GI side effects with the Metformin.  The patient also meets criteria for treatment with a statin.  We will start on Crestor 20 mg once daily.  Follow-up labs in 6 weeks.

## 2019-12-06 NOTE — Assessment & Plan Note (Signed)
Physical exam completed.  Discussed healthy diet.  Encouraged continued exercise.  PSA for prostate cancer screening completed.  Tetanus vaccine given.  Patient declines flu vaccine.  Advised to quit smoking.  Discussed decreasing alcohol intake.  Prior lab work reviewed.

## 2019-12-06 NOTE — Assessment & Plan Note (Signed)
We will give the patient a trial of sildenafil.  Advised he could titrate the dose up if needed.  He will start with 20 mg once daily as needed he can increase to 40 mg or 60 mg once daily as needed.  Advised to seek medical attention if he develops chest pain or shortness of breath during intercourse.  Advised to let EMS or the ED know that he has taken this medication if he ever has to be evaluated.

## 2019-12-07 ENCOUNTER — Telehealth: Payer: Self-pay

## 2019-12-07 NOTE — Telephone Encounter (Signed)
-----   Message from Glori Luis, MD sent at 12/06/2019 10:10 AM EST ----- I forgot to put in for a 6-week follow-up lab appointment for this patient.  Can you please contact him and get him scheduled?  Orders were already placed.

## 2019-12-20 NOTE — Progress Notes (Signed)
I called and spoke with the patient and he is scheduled for a lab appointment in 6 weeks.  Shandon Burlingame,cma

## 2019-12-20 NOTE — Progress Notes (Signed)
Called and patient has no VM setup.  Ryan Wolfe,cma

## 2020-01-12 ENCOUNTER — Other Ambulatory Visit (INDEPENDENT_AMBULATORY_CARE_PROVIDER_SITE_OTHER): Payer: Managed Care, Other (non HMO)

## 2020-01-12 ENCOUNTER — Other Ambulatory Visit: Payer: Self-pay

## 2020-01-12 DIAGNOSIS — E119 Type 2 diabetes mellitus without complications: Secondary | ICD-10-CM | POA: Diagnosis not present

## 2020-01-12 LAB — HEPATIC FUNCTION PANEL
ALT: 26 U/L (ref 0–53)
AST: 18 U/L (ref 0–37)
Albumin: 4.4 g/dL (ref 3.5–5.2)
Alkaline Phosphatase: 41 U/L (ref 39–117)
Bilirubin, Direct: 0.1 mg/dL (ref 0.0–0.3)
Total Bilirubin: 0.3 mg/dL (ref 0.2–1.2)
Total Protein: 7.1 g/dL (ref 6.0–8.3)

## 2020-01-12 LAB — LDL CHOLESTEROL, DIRECT: Direct LDL: 31 mg/dL

## 2020-01-19 ENCOUNTER — Encounter: Payer: Self-pay | Admitting: Family Medicine

## 2020-03-06 ENCOUNTER — Encounter: Payer: Self-pay | Admitting: Family Medicine

## 2020-03-06 ENCOUNTER — Ambulatory Visit: Payer: Managed Care, Other (non HMO) | Admitting: Family Medicine

## 2020-03-06 ENCOUNTER — Other Ambulatory Visit: Payer: Self-pay

## 2020-03-06 DIAGNOSIS — M791 Myalgia, unspecified site: Secondary | ICD-10-CM | POA: Diagnosis not present

## 2020-03-06 DIAGNOSIS — E119 Type 2 diabetes mellitus without complications: Secondary | ICD-10-CM

## 2020-03-06 DIAGNOSIS — E559 Vitamin D deficiency, unspecified: Secondary | ICD-10-CM

## 2020-03-06 DIAGNOSIS — E538 Deficiency of other specified B group vitamins: Secondary | ICD-10-CM | POA: Diagnosis not present

## 2020-03-06 DIAGNOSIS — R5383 Other fatigue: Secondary | ICD-10-CM

## 2020-03-06 DIAGNOSIS — K219 Gastro-esophageal reflux disease without esophagitis: Secondary | ICD-10-CM

## 2020-03-06 LAB — SEDIMENTATION RATE: Sed Rate: 6 mm/hr (ref 0–15)

## 2020-03-06 LAB — HEMOGLOBIN A1C: Hgb A1c MFr Bld: 6.4 % (ref 4.6–6.5)

## 2020-03-06 LAB — COMPREHENSIVE METABOLIC PANEL
ALT: 24 U/L (ref 0–53)
AST: 17 U/L (ref 0–37)
Albumin: 4.5 g/dL (ref 3.5–5.2)
Alkaline Phosphatase: 33 U/L — ABNORMAL LOW (ref 39–117)
BUN: 13 mg/dL (ref 6–23)
CO2: 29 mEq/L (ref 19–32)
Calcium: 9.3 mg/dL (ref 8.4–10.5)
Chloride: 104 mEq/L (ref 96–112)
Creatinine, Ser: 0.58 mg/dL (ref 0.40–1.50)
GFR: 121.39 mL/min (ref >60.00)
Glucose, Bld: 93 mg/dL (ref 70–99)
Potassium: 3.9 mEq/L (ref 3.5–5.1)
Sodium: 138 mEq/L (ref 135–145)
Total Bilirubin: 0.5 mg/dL (ref 0.2–1.2)
Total Protein: 7 g/dL (ref 6.0–8.3)

## 2020-03-06 LAB — VITAMIN B12: Vitamin B-12: 303 pg/mL (ref 211–911)

## 2020-03-06 LAB — CK: Total CK: 124 U/L (ref 7–232)

## 2020-03-06 LAB — TSH: TSH: 2.08 u[IU]/mL (ref 0.35–4.50)

## 2020-03-06 LAB — VITAMIN D 25 HYDROXY (VIT D DEFICIENCY, FRACTURES): VITD: 52.15 ng/mL (ref 30.00–100.00)

## 2020-03-06 NOTE — Assessment & Plan Note (Signed)
Adequate control.  Continue Protonix 40 mg once daily.

## 2020-03-06 NOTE — Assessment & Plan Note (Signed)
Ongoing issue since having had Covid.  This could represent a long Covid issue though could also be related to a number of other causes.  We will check lab work as outlined.  If the lab work does not reveal a cause I would consider referring him to the post-COVID care clinic.

## 2020-03-06 NOTE — Assessment & Plan Note (Signed)
Check A1c.  Continue Metformin 500 mg once daily.

## 2020-03-06 NOTE — Assessment & Plan Note (Signed)
Check vitamin D level 

## 2020-03-06 NOTE — Assessment & Plan Note (Signed)
Possibly related to prior Covid infection though could be related to his cholesterol medication.  We will check lab work as outlined.

## 2020-03-06 NOTE — Progress Notes (Signed)
Tommi Rumps, MD Phone: [phone]  Ryan Wolfe is a 40 y.o. male who presents today for follow-up.  Diabetes: Not checking sugars.  He is taking Metformin XR 500 mg once daily.  Notes occasional polyuria and polydipsia.  He has occasional episodes where he feels hot since he had COVID.  These occur 1-2 times a week.  He wonders if his sugars dropping.  Hyperlipidemia: Taking Crestor.  No chest pain, claudication, or right upper quadrant pain.  Notes since having COVID he has had myalgias.  GERD: Taking Protonix.  No reflux, abdominal pain, blood in stool, or dysphagia.  B12 deficiency: He continues on a B12 supplement.  Vitamin D deficiency: He continues on vitamin D 50,000 units weekly.  History of COVID-19: Patient notes having fatigue issues since having had COVID.  He feels tired most of the time.  Notes muscle and joint aches.  He does not sleep well though that is related to his work schedule.  Social History   Tobacco Use  Smoking Status Former Smoker  Smokeless Tobacco Never Used    Current Outpatient Medications on File Prior to Visit  Medication Sig Dispense Refill  . Cholecalciferol 1.25 MG (50000 UT) capsule Take 1 capsule (50,000 Units total) by mouth once a week. X 6 months then D3 4000 IU daily over the counter 13 capsule 1  . metFORMIN (GLUCOPHAGE-XR) 500 MG 24 hr tablet Take 1 tablet (500 mg total) by mouth daily with breakfast. 90 tablet 1  . pantoprazole (PROTONIX) 40 MG tablet Take 1 tablet (40 mg total) by mouth daily. 30 tablet 3  . rosuvastatin (CRESTOR) 20 MG tablet Take 1 tablet (20 mg total) by mouth daily. 90 tablet 3  . sildenafil (REVATIO) 20 MG tablet Take 1-3 tablets (20-60 mg total) by mouth daily as needed (erectile dysfunction). 10 tablet 0  . vitamin B-12 (CYANOCOBALAMIN) 1000 MCG tablet Take 1 tablet (1,000 mcg total) by mouth daily. 90 tablet 3   No current facility-administered medications on file prior to visit.     ROS see history  of present illness  Objective  Physical Exam Vitals:   03/06/20 1100  BP: 128/80  Pulse: 65  Temp: 97.6 F (36.4 C)  SpO2: 97%    BP Readings from Last 3 Encounters:  03/06/20 128/80  12/05/19 128/76  10/27/19 112/70   Wt Readings from Last 3 Encounters:  03/06/20 185 lb (83.9 kg)  12/05/19 190 lb (86.2 kg)  10/27/19 186 lb 3.2 oz (84.5 kg)    Physical Exam Constitutional:      General: He is not in acute distress.    Appearance: He is not diaphoretic.  Cardiovascular:     Rate and Rhythm: Normal rate and regular rhythm.     Heart sounds: Normal heart sounds.  Pulmonary:     Effort: Pulmonary effort is normal.     Breath sounds: Normal breath sounds.  Musculoskeletal:        General: No edema.     Right lower leg: No edema.     Left lower leg: No edema.     Comments: Very mild muscle tenderness in his arms and legs  Skin:    General: Skin is warm and dry.  Neurological:     Mental Status: He is alert.      Assessment/Plan: Please see individual problem list.  Problem List Items Addressed This Visit    B12 deficiency    Check B12 level.      Relevant Orders  B12   Fatigue    Ongoing issue since having had Covid.  This could represent a long Covid issue though could also be related to a number of other causes.  We will check lab work as outlined.  If the lab work does not reveal a cause I would consider referring him to the post-COVID care clinic.      Relevant Orders   Comp Met (CMET)   TSH   GERD (gastroesophageal reflux disease)    Adequate control.  Continue Protonix 40 mg once daily.      Myalgia    Possibly related to prior Covid infection though could be related to his cholesterol medication.  We will check lab work as outlined.      Relevant Orders   Antinuclear Antib (ANA)   Sedimentation rate   CK (Creatine Kinase)   Comp Met (CMET)   Type 2 diabetes mellitus without complications (HCC)    Check A1c.  Continue Metformin 500 mg  once daily.      Relevant Orders   Comp Met (CMET)   HgB A1c   Vitamin D deficiency    Check vitamin D level.      Relevant Orders   Vitamin D (25 hydroxy)       Health Maintenance: advised to see his ophthalmologist for his yearly eye exam.     This visit occurred during the SARS-CoV-2 public health emergency.  Safety protocols were in place, including screening questions prior to the visit, additional usage of staff PPE, and extensive cleaning of exam room while observing appropriate contact time as indicated for disinfecting solutions.    Tommi Rumps, MD [place]

## 2020-03-06 NOTE — Patient Instructions (Signed)
Nice to see you. We will get lab work today. Once we see the lab results we can determine the next step for your symptoms.

## 2020-03-06 NOTE — Assessment & Plan Note (Signed)
Check B12 level. 

## 2020-03-07 LAB — ANA: Anti Nuclear Antibody (ANA): NEGATIVE

## 2020-03-13 ENCOUNTER — Encounter: Payer: Self-pay | Admitting: Family Medicine

## 2020-03-13 DIAGNOSIS — E119 Type 2 diabetes mellitus without complications: Secondary | ICD-10-CM

## 2020-03-13 DIAGNOSIS — Z0100 Encounter for examination of eyes and vision without abnormal findings: Secondary | ICD-10-CM

## 2020-03-16 ENCOUNTER — Telehealth: Payer: Self-pay | Admitting: Family Medicine

## 2020-03-16 NOTE — Telephone Encounter (Signed)
Mychart message sent to patient advising him to call [facility] to set up an appointment.

## 2020-03-16 NOTE — Telephone Encounter (Signed)
Rejection Reason - Patient did not respond" Royal Piedra said on Mar 16, 2020 9:21 AM  "unable to lm. sent letter. " Royal Piedra said on Mar 16, 2020 9:19 AM  [facility]

## 2020-03-19 NOTE — Telephone Encounter (Signed)
Good morning!  I called [facility] pt is scheduled on 04/06/2020.

## 2020-04-06 LAB — HM DIABETES EYE EXAM

## 2020-04-16 DIAGNOSIS — E785 Hyperlipidemia, unspecified: Secondary | ICD-10-CM

## 2020-04-26 MED ORDER — PRAVASTATIN SODIUM 20 MG PO TABS: 20.0000 mg | ORAL_TABLET | Freq: Every day | ORAL | 0 refills | Status: DC

## 2020-05-10 ENCOUNTER — Ambulatory Visit (INDEPENDENT_AMBULATORY_CARE_PROVIDER_SITE_OTHER): Payer: Managed Care, Other (non HMO)

## 2020-05-10 ENCOUNTER — Ambulatory Visit
Admission: EM | Admit: 2020-05-10 | Discharge: 2020-05-10 | Disposition: A | Discharge status: Home / Self Care | Payer: Managed Care, Other (non HMO) | Attending: Emergency Medicine | Admitting: Emergency Medicine

## 2020-05-10 ENCOUNTER — Other Ambulatory Visit: Payer: Self-pay

## 2020-05-10 DIAGNOSIS — S60222A Contusion of left hand, initial encounter: Secondary | ICD-10-CM

## 2020-05-10 DIAGNOSIS — M7989 Other specified soft tissue disorders: Secondary | ICD-10-CM

## 2020-05-10 MED ORDER — IBUPROFEN 600 MG PO TABS: 600.0000 mg | ORAL_TABLET | Freq: Four times a day (QID) | ORAL | 0 refills | Status: DC | PRN

## 2020-05-10 NOTE — Discharge Instructions (Signed)
Take the ibuprofen as prescribed.  Rest and elevate your hand.  Apply ice packs 2-3 times a day for up to 20 minutes each.  Wear the Ace wrap as needed for comfort.  Follow up with an orthopedist hand specialist.

## 2020-05-10 NOTE — ED Triage Notes (Signed)
Pt presents with L hand pain x 1 week.  Hand was pulled into machine that rolls paper.  Points to 3rd metacarpal area for pain.  Swelling noted across palm and into fingers.  Not able to fully grip.  No numbness or tingling.   Is using ice and Ace bandage at home.

## 2020-05-10 NOTE — ED Provider Notes (Signed)
Renaldo Fiddler    CSN: [id number] Arrival date & time: 05/10/20  5427      History   Chief Complaint Chief Complaint  Patient presents with  . Hand Pain    HPI Galileo Colello is a 40 y.o. male.   Patient presents with pain and swelling in his left hand after he caught it in a machine at work 1 week ago.  He denies numbness, weakness, paresthesias, open wounds, redness, bruising, or other symptoms.  No treatments attempted at home.  His medical history includes type 2 diabetes, GERD, vitamin D deficiency, B12 deficiency, headache, arthritis, low back pain, neck pain, chronic knee pain, anxiety.  The history is provided by the patient and medical records.    Past Medical History:  Diagnosis Date  . Arthritis   . Chicken pox   . GERD (gastroesophageal reflux disease)   . Headache     Patient Active Problem List   Diagnosis Date Noted  . Myalgia 03/06/2020  . Vitamin D deficiency 03/06/2020  . B12 deficiency 03/06/2020  . Encounter for general adult medical examination with abnormal findings 12/05/2019  . 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 Dyspnea on exertion 11/11/2018  . Chronic pain of left knee 11/11/2018  . Constipation 11/11/2018  . Anxiety 11/11/2018  . Fatigue 10/14/2017  . Abdominal fullness 10/14/2017  . GERD (gastroesophageal reflux disease) 12/24/2016  . Neck pain 11/08/2014  . Type 2 diabetes mellitus without complications (HCC) 11/08/2014  . Chest pain 10/27/2014  . Abdominal pain 10/27/2014  . Headache 10/27/2014  . Low back pain 10/27/2014    Past Surgical History:  Procedure Laterality Date  . TONSILLECTOMY AND ADENOIDECTOMY    . UPPER GI ENDOSCOPY         Home Medications    Prior to Admission medications   Medication Sig Start Date End Date Taking? Authorizing Provider  Cholecalciferol 1.25 MG (50000 UT) capsule Take 1 capsule (50,000 Units total) by mouth once a week. X 6 months then D3 4000 IU daily over the counter  10/27/19  Yes McLean-Scocuzza, Pasty Spillers, MD  ibuprofen (ADVIL) 600 MG tablet Take 1 tablet (600 mg total) by mouth every 6 (six) hours as needed. 05/10/20  Yes Mickie Bail, NP  metFORMIN (GLUCOPHAGE-XR) 500 MG 24 hr tablet Take 1 tablet (500 mg total) by mouth daily with breakfast. 12/05/19  Yes Glori Luis, MD  pantoprazole (PROTONIX) 40 MG tablet Take 1 tablet (40 mg total) by mouth daily. 12/05/19  Yes Glori Luis, MD  pravastatin (PRAVACHOL) 20 MG tablet Take 1 tablet (20 mg total) by mouth daily. 04/26/20  Yes Glori Luis, MD  vitamin B-12 (CYANOCOBALAMIN) 1000 MCG tablet Take 1 tablet (1,000 mcg total) by mouth daily. 10/27/19  Yes McLean-Scocuzza, Pasty Spillers, MD  sildenafil (REVATIO) 20 MG tablet Take 1-3 tablets (20-60 mg total) by mouth daily as needed (erectile dysfunction). 12/05/19   Glori Luis, MD    Family History Family History  Problem Relation Age of Onset  . Arthritis Other        grandparent  . Hypertension Other        parent and grandparent  . Diabetes Mother     Social History Social History   Tobacco Use  . Smoking status: Former Games developer  . Smokeless tobacco: Never Used  Vaping Use  . Vaping Use: Never used  Substance Use Topics  . Alcohol use: Yes    Alcohol/week: 5.0 standard drinks  Types: 5 Standard drinks or equivalent per week  . Drug use: No     Allergies   Patient has no known allergies.   Review of Systems Review of Systems  Constitutional: Negative for chills and fever.  HENT: Negative for ear pain and sore throat.   Eyes: Negative for pain and visual disturbance.  Respiratory: Negative for cough and shortness of breath.   Cardiovascular: Negative for chest pain and palpitations.  Gastrointestinal: Negative for abdominal pain and vomiting.  Genitourinary: Negative for dysuria and hematuria.  Musculoskeletal: Positive for arthralgias. Negative for back pain.  Skin: Negative for color change, rash and wound.   Neurological: Negative for syncope, weakness and numbness.  All other systems reviewed and are negative.    Physical Exam Triage Vital Signs ED Triage Vitals  Enc Vitals Group     BP      Pulse      Resp      Temp      Temp src      SpO2      Weight      Height      Head Circumference      Peak Flow      Pain Score      Pain Loc      Pain Edu?      Excl. in GC?    No data found.  Updated Vital Signs BP 120/86 (BP Location: Left Arm)   Pulse 66   Temp 98.6 F (37 C) (Oral)   Resp 18   SpO2 96%   Visual Acuity Right Eye Distance:   Left Eye Distance:   Bilateral Distance:    Right Eye Near:   Left Eye Near:    Bilateral Near:     Physical Exam Vitals and nursing note reviewed.  Constitutional:      General: He is not in acute distress.    Appearance: He is well-developed. He is not ill-appearing.  HENT:     Head: Normocephalic and atraumatic.     Mouth/Throat:     Mouth: Mucous membranes are moist.  Eyes:     Conjunctiva/sclera: Conjunctivae normal.  Cardiovascular:     Rate and Rhythm: Normal rate and regular rhythm.     Heart sounds: Normal heart sounds.  Pulmonary:     Effort: Pulmonary effort is normal. No respiratory distress.     Breath sounds: Normal breath sounds.  Abdominal:     Palpations: Abdomen is soft.     Tenderness: There is no abdominal tenderness.  Musculoskeletal:        General: Swelling and tenderness present. No deformity. Normal range of motion.       Hands:     Cervical back: Neck supple.  Skin:    General: Skin is warm and dry.     Findings: No bruising, erythema, lesion or rash.  Neurological:     General: No focal deficit present.     Mental Status: He is alert and oriented to person, place, and time.     Sensory: No sensory deficit.     Motor: No weakness.     Gait: Gait normal.  Psychiatric:        Mood and Affect: Mood normal.        Behavior: Behavior normal.      UC Treatments / Results  Labs (all  labs ordered are listed, but only abnormal results are displayed) Labs Reviewed - No data to display  EKG   Radiology  DG Hand Complete Left  Result Date: 05/10/2020 CLINICAL DATA:  Left hand injury, pain EXAM: LEFT HAND - COMPLETE 3+ VIEW COMPARISON:  None. FINDINGS: Soft tissue swelling along the dorsum of the hand. No acute bony abnormality. Specifically, no fracture, subluxation, or dislocation. IMPRESSION: No acute bony abnormality. Electronically Signed   By: Charlett Nose M.D.   On: 05/10/2020 10:14    Procedures Procedures (including critical care time)  Medications Ordered in UC Medications - No data to display  Initial Impression / Assessment and Plan / UC Course  I have reviewed the triage vital signs and the nursing notes.  Pertinent labs & imaging results that were available during my care of the patient were reviewed by me and considered in my medical decision making (see chart for details).   Left hand contusion, left hand swelling.  X-ray negative for bony abnormality.  Treating with ibuprofen, rest, elevation, ice packs, Ace wrap.  Instructed patient to follow-up with orthopedic hand specialist.  He agrees to plan of care.   Final Clinical Impressions(s) / UC Diagnoses   Final diagnoses:  Contusion of left hand, initial encounter  Swelling of left hand     Discharge Instructions     Take the ibuprofen as prescribed.  Rest and elevate your hand.  Apply ice packs 2-3 times a day for up to 20 minutes each.  Wear the Ace wrap as needed for comfort.  Follow up with an orthopedist hand specialist.     ED Prescriptions    Medication Sig Dispense Auth. Provider   ibuprofen (ADVIL) 600 MG tablet Take 1 tablet (600 mg total) by mouth every 6 (six) hours as needed. 30 tablet Mickie Bail, NP     PDMP not reviewed this encounter.   Mickie Bail, NP 05/10/20 1044

## 2020-05-11 ENCOUNTER — Emergency Department
Admission: EM | Admit: 2020-05-11 | Discharge: 2020-05-11 | Disposition: A | Discharge status: Home / Self Care | Payer: PRIVATE HEALTH INSURANCE | Attending: Physician Assistant | Admitting: Physician Assistant

## 2020-05-11 ENCOUNTER — Encounter: Payer: Self-pay | Admitting: Emergency Medicine

## 2020-05-11 ENCOUNTER — Other Ambulatory Visit: Payer: Self-pay

## 2020-05-11 DIAGNOSIS — Y990 Civilian activity done for income or pay: Secondary | ICD-10-CM | POA: Diagnosis not present

## 2020-05-11 DIAGNOSIS — Z7984 Long term (current) use of oral hypoglycemic drugs: Secondary | ICD-10-CM | POA: Insufficient documentation

## 2020-05-11 DIAGNOSIS — W230XXA Caught, crushed, jammed, or pinched between moving objects, initial encounter: Secondary | ICD-10-CM | POA: Diagnosis not present

## 2020-05-11 DIAGNOSIS — Y9389 Activity, other specified: Secondary | ICD-10-CM | POA: Insufficient documentation

## 2020-05-11 DIAGNOSIS — S6991XA Unspecified injury of right wrist, hand and finger(s), initial encounter: Secondary | ICD-10-CM | POA: Diagnosis present

## 2020-05-11 DIAGNOSIS — E119 Type 2 diabetes mellitus without complications: Secondary | ICD-10-CM | POA: Diagnosis not present

## 2020-05-11 DIAGNOSIS — Y9289 Other specified places as the place of occurrence of the external cause: Secondary | ICD-10-CM | POA: Insufficient documentation

## 2020-05-11 DIAGNOSIS — Z87891 Personal history of nicotine dependence: Secondary | ICD-10-CM | POA: Insufficient documentation

## 2020-05-11 DIAGNOSIS — S60221D Contusion of right hand, subsequent encounter: Secondary | ICD-10-CM | POA: Insufficient documentation

## 2020-05-11 NOTE — ED Triage Notes (Signed)
Pt comes into the ED via POV c/o hand injury.  Pt states he got his hand stuck in a machine at work 8 days ago.  Pt saw [facility] urgent care yesterday where they confirmed that there is nothing broken in the hand but that there were strains on the hand tendons.  Pt's work then found out and informed him he has to be seen under workers comp and complete a UDS.  Pt denies any new problems since seeing the UC yesterday.  Pt was informed to f/u here so her can have work restrictions, etc.

## 2020-05-11 NOTE — ED Notes (Signed)
See triage note  Presents with injury to left hand  States he caught his hand in a machine about 8 days ago  Was seen yesterday for same  States he is here fr UDS

## 2020-05-11 NOTE — ED Provider Notes (Signed)
[facility] Emergency Department Provider Note   ____________________________________________   Event Date/Time   First MD Initiated Contact with Patient 05/11/20 1307     (approximate)  I have reviewed the triage vital signs and the nursing notes.   HISTORY  Chief Complaint Hand Injury    HPI Ryan Wolfe is a 40 y.o. male patient presents for evaluation of left hand pain.  Hand was caught in a machine at work 8 days ago.  Patient was seen in urgent care and had negative x-rays.  Patient here today for evaluation of work status and get a urine drug screen.  Patient is right-hand dominant.  Patient states still has swelling 8 days after the injury.  States swelling has decreased status post applying of Ace wrap given by urgent care clinic yesterday.  Patient state unable to make a complete fist secondary to the swelling and pain.  Rates his pain as a 1/10.         Past Medical History:  Diagnosis Date  . Arthritis   . Chicken pox   . GERD (gastroesophageal reflux disease)   . Headache     Patient Active Problem List   Diagnosis Date Noted  . Myalgia 03/06/2020  . Vitamin D deficiency 03/06/2020  . B12 deficiency 03/06/2020  . Encounter for general adult medical examination with abnormal findings 12/05/2019  . 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 Dyspnea on exertion 11/11/2018  . Chronic pain of left knee 11/11/2018  . Constipation 11/11/2018  . Anxiety 11/11/2018  . Fatigue 10/14/2017  . Abdominal fullness 10/14/2017  . GERD (gastroesophageal reflux disease) 12/24/2016  . Neck pain 11/08/2014  . Type 2 diabetes mellitus without complications (HCC) 11/08/2014  . Chest pain 10/27/2014  . Abdominal pain 10/27/2014  . Headache 10/27/2014  . Low back pain 10/27/2014    Past Surgical History:  Procedure Laterality Date  . TONSILLECTOMY AND ADENOIDECTOMY    . UPPER GI ENDOSCOPY      Prior to Admission medications   Medication  Sig Start Date End Date Taking? Authorizing Provider  Cholecalciferol 1.25 MG (50000 UT) capsule Take 1 capsule (50,000 Units total) by mouth once a week. X 6 months then D3 4000 IU daily over the counter 10/27/19   McLean-Scocuzza, Pasty Spillers, MD  ibuprofen (ADVIL) 600 MG tablet Take 1 tablet (600 mg total) by mouth every 6 (six) hours as needed. 05/10/20   Mickie Bail, NP  metFORMIN (GLUCOPHAGE-XR) 500 MG 24 hr tablet Take 1 tablet (500 mg total) by mouth daily with breakfast. 12/05/19   Glori Luis, MD  pantoprazole (PROTONIX) 40 MG tablet Take 1 tablet (40 mg total) by mouth daily. 12/05/19   Glori Luis, MD  pravastatin (PRAVACHOL) 20 MG tablet Take 1 tablet (20 mg total) by mouth daily. 04/26/20   Glori Luis, MD  sildenafil (REVATIO) 20 MG tablet Take 1-3 tablets (20-60 mg total) by mouth daily as needed (erectile dysfunction). 12/05/19   Glori Luis, MD  vitamin B-12 (CYANOCOBALAMIN) 1000 MCG tablet Take 1 tablet (1,000 mcg total) by mouth daily. 10/27/19   McLean-Scocuzza, Pasty Spillers, MD    Allergies Patient has no known allergies.  Family History  Problem Relation Age of Onset  . Arthritis Other        grandparent  . Hypertension Other        parent and grandparent  . Diabetes Mother     Social History Social History   Tobacco Use  .  Smoking status: Former Games developer  . Smokeless tobacco: Never Used  Vaping Use  . Vaping Use: Never used  Substance Use Topics  . Alcohol use: Yes    Alcohol/week: 5.0 standard drinks    Types: 5 Standard drinks or equivalent per week  . Drug use: No    Review of Systems Constitutional: No fever/chills Eyes: No visual changes. ENT: No sore throat. Cardiovascular: Denies chest pain. Respiratory: Denies shortness of breath. Gastrointestinal: No abdominal pain.  No nausea, no vomiting.  No diarrhea.  No constipation. Genitourinary: Negative for dysuria. Musculoskeletal: Left hand edema  skin: Negative for  rash. Neurological: Negative for headaches, focal weakness or numbness.   ____________________________________________   PHYSICAL EXAM:  VITAL SIGNS: ED Triage Vitals  Enc Vitals Group     BP 05/11/20 1242 135/88     Pulse Rate 05/11/20 1242 71     Resp 05/11/20 1242 17     Temp 05/11/20 1242 98.2 F (36.8 C)     Temp Source 05/11/20 1242 Oral     SpO2 05/11/20 1242 97 %     Weight 05/11/20 1243 185 lb (83.9 kg)     Height 05/11/20 1243 5\' 5"  (1.651 m)     Head Circumference --      Peak Flow --      Pain Score 05/11/20 1242 1     Pain Loc --      Pain Edu? --      Excl. in GC? --    Constitutional: Alert and oriented. Well appearing and in no acute distress. Cardiovascular: Normal rate, regular rhythm. Grossly normal heart sounds.  Good peripheral circulation. Respiratory: Normal respiratory effort.  No retractions. Lungs CTAB. Musculoskeletal: No obvious deformity to the left hand.  Moderate edema is apparent.  Patient decreased range of motion with flexion of the phalanges. Neurologic:  Normal speech and language. No gross focal neurologic deficits are appreciated. No gait instability. Skin:  Skin is warm, dry and intact. No rash noted. Psychiatric: Mood and affect are normal. Speech and behavior are normal.  ____________________________________________   LABS (all labs ordered are listed, but only abnormal results are displayed)  Labs Reviewed - No data to display ____________________________________________  EKG   ____________________________________________  RADIOLOGY I, 05/13/20, personally viewed and evaluated these images (plain radiographs) as part of my medical decision making, as well as reviewing the written report by the radiologist.  ED MD interpretation:    Official radiology report(s): No results found.  ____________________________________________   PROCEDURES  Procedure(s) performed (including Critical  Care):  Procedures   ____________________________________________   INITIAL IMPRESSION / ASSESSMENT AND PLAN / ED COURSE  As part of my medical decision making, I reviewed the following data within the electronic MEDICAL RECORD NUMBER         Patient presents with pain and swelling status post contusion.  Patient given discharge care instruction advised follow-up with Ortho for definitive evaluation and treatment.      ____________________________________________   FINAL CLINICAL IMPRESSION(S) / ED DIAGNOSES  Final diagnoses:  Contusion of right hand, subsequent encounter     ED Discharge Orders    None      *Please note:  Ryan Wolfe was evaluated in Emergency Department on 05/11/2020 for the symptoms described in the history of present illness. He was evaluated in the context of the global COVID-19 pandemic, which necessitated consideration that the patient might be at risk for infection with the SARS-CoV-2 virus that causes COVID-19. Institutional  protocols and algorithms that pertain to the evaluation of patients at risk for COVID-19 are in a state of rapid change based on information released by regulatory bodies including the CDC and federal and state organizations. These policies and algorithms were followed during the patient's care in the ED.  Some ED evaluations and interventions may be delayed as a result of limited staffing during and the pandemic.*   Note:  This document was prepared using Dragon voice recognition software and may include unintentional dictation errors.    Joni Reining, PA-C 05/11/20 1436    Merwyn Katos, MD 05/12/20 [phone]

## 2020-05-11 NOTE — Discharge Instructions (Signed)
Follow discharge care instruction continue to wear Ace wrap until evaluation by orthopedics.  Do not sleep in Ace wrap.

## 2020-06-03 ENCOUNTER — Other Ambulatory Visit: Payer: Self-pay | Admitting: Family Medicine

## 2020-06-03 DIAGNOSIS — E119 Type 2 diabetes mellitus without complications: Secondary | ICD-10-CM

## 2020-06-13 ENCOUNTER — Ambulatory Visit: Payer: Managed Care, Other (non HMO) | Admitting: Family Medicine

## 2020-06-13 DIAGNOSIS — Z0289 Encounter for other administrative examinations: Secondary | ICD-10-CM

## 2020-08-02 ENCOUNTER — Other Ambulatory Visit: Payer: Self-pay | Admitting: Family Medicine

## 2020-08-02 DIAGNOSIS — E785 Hyperlipidemia, unspecified: Secondary | ICD-10-CM

## 2020-08-08 ENCOUNTER — Telehealth: Payer: Self-pay | Admitting: Family Medicine

## 2020-08-08 ENCOUNTER — Emergency Department
Admission: EM | Admit: 2020-08-08 | Discharge: 2020-08-08 | Disposition: A | Discharge status: Home / Self Care | Payer: 59 | Attending: Emergency Medicine | Admitting: Emergency Medicine

## 2020-08-08 ENCOUNTER — Other Ambulatory Visit: Payer: Self-pay

## 2020-08-08 DIAGNOSIS — Z87891 Personal history of nicotine dependence: Secondary | ICD-10-CM | POA: Insufficient documentation

## 2020-08-08 DIAGNOSIS — Z7984 Long term (current) use of oral hypoglycemic drugs: Secondary | ICD-10-CM | POA: Insufficient documentation

## 2020-08-08 DIAGNOSIS — E119 Type 2 diabetes mellitus without complications: Secondary | ICD-10-CM | POA: Diagnosis not present

## 2020-08-08 DIAGNOSIS — M79672 Pain in left foot: Secondary | ICD-10-CM | POA: Insufficient documentation

## 2020-08-08 DIAGNOSIS — M79671 Pain in right foot: Secondary | ICD-10-CM

## 2020-08-08 MED ORDER — MELOXICAM 15 MG PO TABS: 15.0000 mg | ORAL_TABLET | Freq: Every day | ORAL | 0 refills | Status: DC

## 2020-08-08 MED ORDER — ONDANSETRON 4 MG PO TBDP: 4.0000 mg | ORAL_TABLET | Freq: Three times a day (TID) | ORAL | 0 refills | Status: DC | PRN

## 2020-08-08 MED ORDER — ONDANSETRON 8 MG PO TBDP
8.0000 mg | ORAL_TABLET | Freq: Once | ORAL | Status: AC
  Administered 2020-08-08: 8 mg via ORAL
  Filled 2020-08-08: qty 1

## 2020-08-08 MED ORDER — KETOROLAC TROMETHAMINE 30 MG/ML IJ SOLN
30.0000 mg | Freq: Once | INTRAMUSCULAR | Status: AC
  Administered 2020-08-08: 30 mg via INTRAMUSCULAR
  Filled 2020-08-08: qty 1

## 2020-08-08 NOTE — ED Notes (Signed)
ED Provider at bedside. 

## 2020-08-08 NOTE — Telephone Encounter (Signed)
Patient was advised to go to UC per access nurse

## 2020-08-08 NOTE — ED Triage Notes (Signed)
Pt come with c/o foot  burning and some pain. Pt states some burning to arms the patient states his PCP told him to come get checked out bc it might be neuropathy

## 2020-08-08 NOTE — Telephone Encounter (Signed)
patient is currently being evaluated at ED/UC.

## 2020-08-08 NOTE — ED Notes (Signed)
Patient reports burning and nagging pani to the bottom of both feet. Patient reports pain is worse in the soles of his feet. Patient also reports intermittent pain to the bilateral arms as well. Patient reports PCP sent him here for evaluation. Patient ambulatory with steady gait. Patient reports suspected neuropathy.

## 2020-08-08 NOTE — ED Provider Notes (Signed)
[facility] Emergency Department Provider Note  ____________________________________________  Time seen: Approximately 6:11 PM  I have reviewed the triage vital signs and the nursing notes.   HISTORY  Chief Complaint Foot Pain    HPI Ryan Wolfe is a 40 y.o. male presents the emergency department complaining of bilateral foot pain.  Patient states that he is a diabetic and was concerned that he may have neuropathy.  States that he has had symptoms for the past 2 to 3 weeks.  He describes it mostly along the ball of the feet extending to the heels.  Initially patient had reports of upper and lower extremity pain but on upon further discussion it appears that this is primarily located in the feet bilaterally.  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  He has had the boots for years and does not change out the insoles.  Patient denies any direct trauma to his feet.  He has no loss of sensation to the toes or any aspect of his foot.  There is no reported edema or erythema.  Patient denies any calf or leg pain.       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   Dyspnea on exertion 11/11/2018   Chronic pain of left knee 11/11/2018   Constipation 11/11/2018   Anxiety 11/11/2018   Fatigue 10/14/2017   Abdominal fullness 10/14/2017   GERD (gastroesophageal reflux disease) 12/24/2016   Neck pain 11/08/2014   Type 2 diabetes mellitus without complications (HCC) 11/08/2014   Chest pain 10/27/2014   Abdominal pain 10/27/2014   Headache  10/27/2014   Low back pain 10/27/2014    Past Surgical History:  Procedure Laterality Date   TONSILLECTOMY AND ADENOIDECTOMY     UPPER GI ENDOSCOPY      Prior to Admission medications   Medication Sig Start Date End Date Taking? Authorizing Provider  meloxicam (MOBIC) 15 MG tablet Take 1 tablet (15 mg total) by mouth daily. 08/08/20  Yes Phyllis Abelson, Delorise Royals, PA-C  ondansetron (ZOFRAN-ODT) 4 MG disintegrating tablet Take 1 tablet (4 mg total) by mouth every 8 (eight) hours as needed for nausea or vomiting. 08/08/20  Yes Leonila Speranza, Delorise Royals, PA-C  Cholecalciferol 1.25 MG (50000 UT) capsule Take 1 capsule (50,000 Units total) by mouth once a week. X 6 months then D3 4000 IU daily over the counter 10/27/19   McLean-Scocuzza, Pasty Spillers, MD  ibuprofen (ADVIL) 600 MG tablet Take 1 tablet (600 mg total) by mouth every 6 (six) hours as needed. 05/10/20   Mickie Bail, NP  metFORMIN (GLUCOPHAGE-XR) 500 MG 24 hr tablet TAKE 1 TABLET(500 MG) BY MOUTH DAILY WITH BREAKFAST 06/04/20   Glori Luis, MD  pantoprazole (PROTONIX) 40 MG tablet Take 1 tablet (40 mg total) by mouth daily. 12/05/19   Glori Luis, MD  pravastatin (PRAVACHOL) 20 MG tablet TAKE 1 TABLET(20 MG) BY MOUTH DAILY 08/02/20   Glori Luis, MD  sildenafil (REVATIO) 20 MG tablet Take 1-3 tablets (20-60 mg total) by mouth daily  as needed (erectile dysfunction). 12/05/19   Glori Luis, MD  vitamin B-12 (CYANOCOBALAMIN) 1000 MCG tablet Take 1 tablet (1,000 mcg total) by mouth daily. 10/27/19   McLean-Scocuzza, Pasty Spillers, MD    Allergies Patient has no known allergies.  Family History  Problem Relation Age of Onset   Arthritis Other        grandparent   Hypertension Other        parent and grandparent   Diabetes Mother     Social History Social History   Tobacco Use   Smoking status: Former    Pack years: 0.00   Smokeless tobacco: Never  Vaping Use   Vaping Use: Never used  Substance Use Topics   Alcohol use:  Yes    Alcohol/week: 5.0 standard drinks    Types: 5 Standard drinks or equivalent per week   Drug use: No     Review of Systems  Constitutional: No fever/chills Eyes: No visual changes. No discharge ENT: No upper respiratory complaints. Cardiovascular: no chest pain. Respiratory: no cough. No SOB. Gastrointestinal: No abdominal pain.  No nausea, no vomiting.  No diarrhea.  No constipation. Musculoskeletal: Positive for bilateral foot pain Skin: Negative for rash, abrasions, lacerations, ecchymosis. Neurological: Negative for headaches, focal weakness or numbness.  10 System ROS otherwise negative.  ____________________________________________   PHYSICAL EXAM:  VITAL SIGNS: ED Triage Vitals  Enc Vitals Group     BP 08/08/20 1536 131/86     Pulse Rate 08/08/20 1536 61     Resp 08/08/20 1536 18     Temp 08/08/20 1536 98.7 F (37.1 C)     Temp src --      SpO2 08/08/20 1536 100 %     Weight --      Height --      Head Circumference --      Peak Flow --      Pain Score 08/08/20 1535 5     Pain Loc --      Pain Edu? --      Excl. in GC? --      Constitutional: Alert and oriented. Well appearing and in no acute distress. Eyes: Conjunctivae are normal. PERRL. EOMI. Head: Atraumatic. ENT:      Ears:       Nose: No congestion/rhinnorhea.      Mouth/Throat: Mucous membranes are moist.  Neck: No stridor.    Cardiovascular: Normal rate, regular rhythm. Normal S1 and S2.  Good peripheral circulation. Respiratory: Normal respiratory effort without tachypnea or retractions. Lungs CTAB. Good air entry to the bases with no decreased or absent breath sounds. Musculoskeletal: Full range of motion to all extremities. No gross deformities appreciated.  Visualization of the feet reveals no wounds.  No duskiness.  Feet are warm bilaterally good pulses and sensation.  Patient is minimally tender along the plantar aspect of both feet without palpable abnormality. Neurologic:  Normal  speech and language. No gross focal neurologic deficits are appreciated.  Skin:  Skin is warm, dry and intact. No rash noted. Psychiatric: Mood and affect are normal. Speech and behavior are normal. Patient exhibits appropriate insight and judgement.   ____________________________________________   LABS (all labs ordered are listed, but only abnormal results are displayed)  Labs Reviewed - No data to display ____________________________________________  EKG   ____________________________________________  RADIOLOGY   No results found.  ____________________________________________    PROCEDURES  Procedure(s) performed:    Procedures    Medications  ketorolac (TORADOL) 30 MG/ML  injection 30 mg (has no administration in time range)  ondansetron (ZOFRAN-ODT) disintegrating tablet 8 mg (has no administration in time range)     ____________________________________________   INITIAL IMPRESSION / ASSESSMENT AND PLAN / ED COURSE  Pertinent labs & imaging results that were available during my care of the patient were reviewed by me and considered in my medical decision making (see chart for details).  Review of the [place] CSRS was performed in accordance of the [facility] prior to dispensing any controlled drugs.           Patient's diagnosis is consistent with bilateral foot pain.  Patient presented to the emergency department complaining of bilateral foot pain for the last 2 to 3 weeks.  He was concern for neuropathy given his diabetes history.  He states that his A1c was 6.6 at last check any works hard at maintaining good blood sugars.  Patient has no loss of sensation.  This is only been occurring for the last 2 to 3 weeks.  He wears boots at work and has worn same pair for the past several years.  Given the physical exam I have a higher suspicion for plantar fascia this followed by lumbar radiculopathy and neuropathy.  At this time I we will trial the patient with encouraging  him to either purchase new boots or use inserts in the boots to further cushion his walking.  I will trial an anti-inflammatory.  If symptoms are worsening, other symptoms are developing or there is no improvement off of the conservative treatment follow-up with primary care for further evaluation for neuropathy. Patient is given ED precautions to return to the ED for any worsening or new symptoms.     ____________________________________________  FINAL CLINICAL IMPRESSION(S) / ED DIAGNOSES  Final diagnoses:  Foot pain, bilateral      NEW MEDICATIONS STARTED DURING THIS VISIT:  ED Discharge Orders          Ordered    meloxicam (MOBIC) 15 MG tablet  Daily        08/08/20 1929    ondansetron (ZOFRAN-ODT) 4 MG disintegrating tablet  Every 8 hours PRN        08/08/20 1929                This chart was dictated using voice recognition software/Dragon. Despite best efforts to proofread, errors can occur which can change the meaning. Any change was purely unintentional.    Racheal Patches, PA-C 08/08/20 1934    Gilles Chiquito, MD 08/09/20 [phone]

## 2020-08-08 NOTE — Telephone Encounter (Signed)
Left message for patient to return call back.  

## 2020-08-08 NOTE — Telephone Encounter (Signed)
Transfer to Access Nurse   PT called to advise that he thinks his sugar might be off. He has a burning sensation in his feet, face and arms. He states this has been going on for a few weeks now. We have no apts available and transfer to Access Nurse.

## 2020-08-08 NOTE — Telephone Encounter (Signed)
Noted. Please follow-up with the patient to make sure he is evaluated for this. Thanks.

## 2020-08-22 ENCOUNTER — Encounter: Payer: Self-pay | Admitting: Family Medicine

## 2020-08-22 ENCOUNTER — Telehealth (INDEPENDENT_AMBULATORY_CARE_PROVIDER_SITE_OTHER): Payer: 59 | Admitting: Family Medicine

## 2020-08-22 DIAGNOSIS — J028 Acute pharyngitis due to other specified organisms: Secondary | ICD-10-CM | POA: Diagnosis not present

## 2020-08-22 DIAGNOSIS — E559 Vitamin D deficiency, unspecified: Secondary | ICD-10-CM

## 2020-08-22 DIAGNOSIS — E119 Type 2 diabetes mellitus without complications: Secondary | ICD-10-CM

## 2020-08-22 DIAGNOSIS — M79672 Pain in left foot: Secondary | ICD-10-CM

## 2020-08-22 DIAGNOSIS — M79671 Pain in right foot: Secondary | ICD-10-CM

## 2020-08-22 DIAGNOSIS — R079 Chest pain, unspecified: Secondary | ICD-10-CM | POA: Diagnosis not present

## 2020-08-22 DIAGNOSIS — M79673 Pain in unspecified foot: Secondary | ICD-10-CM | POA: Insufficient documentation

## 2020-08-22 DIAGNOSIS — B9789 Other viral agents as the cause of diseases classified elsewhere: Secondary | ICD-10-CM

## 2020-08-22 MED ORDER — BLOOD GLUCOSE MONITOR KIT: PACK | 0 refills | Status: AC

## 2020-08-22 NOTE — Progress Notes (Signed)
Virtual Visit via telephone Note  This visit type was conducted due to national recommendations for restrictions regarding the COVID-19 pandemic (e.g. social distancing).  This format is felt to be most appropriate for this patient at this time.  All issues noted in this document were discussed and addressed.  No physical exam was performed (except for noted visual exam findings with Video Visits).   I connected with Ryan Wolfe today at  2:15 PM EDT by telephone and verified that I am speaking with the correct person using two identifiers. Location patient: car Location provider: work Persons participating in the virtual visit: patient, provider  I discussed the limitations, risks, security and privacy concerns of performing an evaluation and management service by telephone and the availability of in person appointments. I also discussed with the patient that there may be a patient responsible charge related to this service. The patient expressed understanding and agreed to proceed.  Interactive audio and video telecommunications were attempted between this provider and patient, however failed, due to patient having technical difficulties OR patient did not have access to video capability.  We continued and completed visit with audio only.   Reason for visit: f/u  HPI: Diabetes: Not checking blood glucose.  Taking metformin.  Some nocturia though notes the nocturia has been going on quite some time and he drinks a lot of fluid.  Notes occasionally feeling jittery though he does not check his sugars.  Notes this responds to eating food. Patient has been on the pravastatin every other day.  He notes he has tolerated that though did not tolerate taking it daily.  Foot pain: Patient was recently seen in the emergency department.  It looks like they felt as though this was related to plantar fasciitis or possibly nerve impingement.  The patient describes it as a cold burn.  He notes the ED  physician did not feel that this was neuropathy.  The patient notes he has gotten new insoles for her shoes and that has been beneficial.  Sore throat: Patient notes this occurred last week.  He had a negative COVID test.  He just was not feeling very good.  He notes this has resolved.  Notes his kids were sick as well.  Atypical chest pain: Patient notes this has been going on for years.  He had a cardiac evaluation back in 2016 for this that was negative for cardiac cause.  It occurs in his right upper chest near his shoulder and is described as a cramp.  There is no exertional component.  There is no diaphoresis.  It does not radiate.  It lasts for 20 to 30 minutes at a time and typically resolves with massage.  Vitamin D deficiency: Patient has been taking 5000 international units of vitamin D daily.   ROS: See pertinent positives and negatives per HPI.  Past Medical History:  Diagnosis Date   Arthritis    Chicken pox    GERD (gastroesophageal reflux disease)    Headache     Past Surgical History:  Procedure Laterality Date   TONSILLECTOMY AND ADENOIDECTOMY     UPPER GI ENDOSCOPY      Family History  Problem Relation Age of Onset   Arthritis Other        grandparent   Hypertension Other        parent and grandparent   Diabetes Mother     SOCIAL HX: Former smoker   Current Outpatient Medications:    blood glucose meter  kit and supplies KIT, Dispense based on patient and insurance preference. Use once daily fasting. Dx Code E11.9., Disp: 1 each, Rfl: 0   ibuprofen (ADVIL) 600 MG tablet, Take 1 tablet (600 mg total) by mouth every 6 (six) hours as needed., Disp: 30 tablet, Rfl: 0   meloxicam (MOBIC) 15 MG tablet, Take 1 tablet (15 mg total) by mouth daily., Disp: 30 tablet, Rfl: 0   metFORMIN (GLUCOPHAGE-XR) 500 MG 24 hr tablet, TAKE 1 TABLET(500 MG) BY MOUTH DAILY WITH BREAKFAST, Disp: 90 tablet, Rfl: 1   ondansetron (ZOFRAN-ODT) 4 MG disintegrating tablet, Take 1 tablet  (4 mg total) by mouth every 8 (eight) hours as needed for nausea or vomiting., Disp: 20 tablet, Rfl: 0   pantoprazole (PROTONIX) 40 MG tablet, Take 1 tablet (40 mg total) by mouth daily., Disp: 30 tablet, Rfl: 3   pravastatin (PRAVACHOL) 20 MG tablet, TAKE 1 TABLET(20 MG) BY MOUTH DAILY, Disp: 90 tablet, Rfl: 0   vitamin B-12 (CYANOCOBALAMIN) 1000 MCG tablet, Take 1 tablet (1,000 mcg total) by mouth daily., Disp: 90 tablet, Rfl: 3   Cholecalciferol 1.25 MG (50000 UT) capsule, Take 1 capsule (50,000 Units total) by mouth once a week. X 6 months then D3 4000 IU daily over the counter (Patient not taking: Reported on 08/22/2020), Disp: 13 capsule, Rfl: 1   sildenafil (REVATIO) 20 MG tablet, Take 1-3 tablets (20-60 mg total) by mouth daily as needed (erectile dysfunction). (Patient not taking: Reported on 08/22/2020), Disp: 10 tablet, Rfl: 0  EXAM: This was a telephone visit and thus no physical exam was completed.  ASSESSMENT AND PLAN:  Discussed the following assessment and plan:  Problem List Items Addressed This Visit     Chest pain    This is the same discomfort that he has had for many years.  It is atypical in nature.  He had a negative cardiac evaluation previously.  Suspect musculoskeletal cause.  He will monitor.       Foot pain    Improving.  He will monitor with the new insoles.       Sore throat (viral)    Likely viral.  Symptoms have resolved.  He will monitor for recurrence.       Type 2 diabetes mellitus without complications (HCC)    Undetermined control.  We will have him come in for an A1c.  He will continue metformin 500 mg daily.  He will continue on pravastatin every other day and we will plan on checking his lipid panel as well.       Relevant Medications   blood glucose meter kit and supplies KIT   Other Relevant Orders   Comp Met (CMET)   HgB A1c   Lipid panel   Vitamin D deficiency    Check with upcoming labs.       Relevant Orders   Vitamin D (25  hydroxy)    Return in about 1 week (around 08/29/2020) for labs, 4 months PCP.   I discussed the assessment and treatment plan with the patient. The patient was provided an opportunity to ask questions and all were answered. The patient agreed with the plan and demonstrated an understanding of the instructions.   The patient was advised to call back or seek an in-person evaluation if the symptoms worsen or if the condition fails to improve as anticipated.  I provided 19 minutes of non-face-to-face time during this encounter.   Eric Sonnenberg, MD   

## 2020-08-22 NOTE — Assessment & Plan Note (Signed)
Likely viral.  Symptoms have resolved.  He will monitor for recurrence.

## 2020-08-22 NOTE — Assessment & Plan Note (Signed)
Improving.  He will monitor with the new insoles.

## 2020-08-22 NOTE — Assessment & Plan Note (Addendum)
Undetermined control.  We will have him come in for an A1c.  He will continue metformin 500 mg daily.  He will continue on pravastatin every other day and we will plan on checking his lipid panel as well.

## 2020-08-22 NOTE — Assessment & Plan Note (Signed)
This is the same discomfort that he has had for many years.  It is atypical in nature.  He had a negative cardiac evaluation previously.  Suspect musculoskeletal cause.  He will monitor.

## 2020-08-22 NOTE — Assessment & Plan Note (Signed)
Check with upcoming labs.

## 2020-12-02 ENCOUNTER — Other Ambulatory Visit: Payer: Self-pay | Admitting: Family Medicine

## 2020-12-02 DIAGNOSIS — E785 Hyperlipidemia, unspecified: Secondary | ICD-10-CM

## 2020-12-02 DIAGNOSIS — E119 Type 2 diabetes mellitus without complications: Secondary | ICD-10-CM

## 2021-01-28 ENCOUNTER — Emergency Department
Admission: EM | Admit: 2021-01-28 | Discharge: 2021-01-28 | Discharge status: Against Medical Advice | Payer: 59 | Source: Home / Self Care

## 2021-03-06 ENCOUNTER — Other Ambulatory Visit: Payer: Self-pay | Admitting: Family Medicine

## 2021-03-06 DIAGNOSIS — E785 Hyperlipidemia, unspecified: Secondary | ICD-10-CM

## 2021-03-06 DIAGNOSIS — E119 Type 2 diabetes mellitus without complications: Secondary | ICD-10-CM

## 2021-04-22 ENCOUNTER — Telehealth: Payer: Self-pay | Admitting: Family Medicine

## 2021-04-22 NOTE — Telephone Encounter (Signed)
I received a reminder from the EMR that the patient did not have his labs done last year. It looks like he is also due for follow-up. Please contact him and see if he is still coming to our office and if so please get him scheduled for a follow-up. Thanks.  ?

## 2021-04-23 NOTE — Telephone Encounter (Signed)
I called and the phone would not go through.  Radley Barto,cma  ?

## 2021-04-25 NOTE — Telephone Encounter (Signed)
I called and no VM, no answer to schedule a follow up with the patient.  Ryan Wolfe,cma  ?

## 2021-04-29 NOTE — Telephone Encounter (Signed)
No VM no answer a letter is sent out today for the patient to call and schedule a follow up.  Sequoia Mincey,cma  ?

## 2022-01-24 ENCOUNTER — Other Ambulatory Visit: Payer: Self-pay

## 2022-01-24 DIAGNOSIS — E119 Type 2 diabetes mellitus without complications: Secondary | ICD-10-CM

## 2022-03-03 ENCOUNTER — Ambulatory Visit
Admission: EM | Admit: 2022-03-03 | Discharge: 2022-03-03 | Disposition: A | Discharge status: Home / Self Care | Payer: 59 | Attending: Emergency Medicine | Admitting: Emergency Medicine

## 2022-03-03 DIAGNOSIS — B349 Viral infection, unspecified: Secondary | ICD-10-CM | POA: Insufficient documentation

## 2022-03-03 DIAGNOSIS — Z1152 Encounter for screening for COVID-19: Secondary | ICD-10-CM | POA: Insufficient documentation

## 2022-03-03 MED ORDER — IBUPROFEN 400 MG PO TABS
400.0000 mg | ORAL_TABLET | ORAL | Status: AC
  Administered 2022-03-03: 400 mg via ORAL

## 2022-03-03 NOTE — Discharge Instructions (Addendum)
Your COVID test is pending.    Take Tylenol as needed for fever or discomfort.  Rest and keep yourself hydrated.    Follow-up with your primary care provider if your symptoms are not improving.     

## 2022-03-03 NOTE — ED Triage Notes (Addendum)
Patient to Urgent Care with complaints of nasal/ chest congestion, productive with thick, brown/ 'lime green' mucus and loss of taste. Fatigue and body aches.  Symptoms started two days ago. Using halls cough drops and tylenol severe day time. Last dose 6/7am.

## 2022-03-03 NOTE — ED Provider Notes (Signed)
Ryan Wolfe    CSN: [id number] Arrival date & time: 03/03/22  1429      History   Chief Complaint Chief Complaint  Patient presents with   Nasal Congestion    HPI Ryan Wolfe is a 42 y.o. male.  Patient presents with 2 day history of fever, body aches, fatigue, congestion, cough.  Treatment with Tylenol cold medication; last taken at 0700.  No rash, ear pain, sore throat, shortness of breath, abdominal pain, vomiting, diarrhea, or other symptoms.  His medical history includes diabetes.   The history is provided by the patient and medical records.    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   Dyspnea on exertion 11/11/2018   Chronic pain of left knee 11/11/2018   Constipation 11/11/2018   Anxiety 11/11/2018   Fatigue 10/14/2017   Abdominal fullness 10/14/2017   GERD (gastroesophageal reflux disease) 12/24/2016   Neck pain 11/08/2014   Type 2 diabetes mellitus without complications ([place]) 50/09/3816   Chest pain 10/27/2014   Abdominal pain 10/27/2014   Headache 10/27/2014   Low back pain 10/27/2014    Past Surgical History:  Procedure Laterality Date   TONSILLECTOMY AND ADENOIDECTOMY     UPPER GI ENDOSCOPY         Home Medications    Prior to Admission medications   Medication Sig Start Date End Date Taking? Authorizing Provider  omeprazole (PRILOSEC) 20 MG capsule Take by mouth. 01/31/22  Yes [provider]  blood glucose meter kit and supplies KIT Dispense based on patient and insurance preference. Use once daily fasting. Dx Code E11.9.  08/22/20   Leone Haven, MD  Cholecalciferol 1.25 MG (50000 UT) capsule Take 1 capsule (50,000 Units total) by mouth once a week. X 6 months then D3 4000 IU daily over the counter Patient not taking: Reported on 08/22/2020 10/27/19   McLean-Scocuzza, Nino Glow, MD  ibuprofen (ADVIL) 600 MG tablet Take 1 tablet (600 mg total) by mouth every 6 (six) hours as needed. 05/10/20   Sharion Balloon, NP  meloxicam (MOBIC) 15 MG tablet Take 1 tablet (15 mg total) by mouth daily. 08/08/20   Cuthriell, Charline Bills, PA-C  metFORMIN (GLUCOPHAGE-XR) 500 MG 24 hr tablet TAKE 1 TABLET(500 MG) BY MOUTH DAILY WITH BREAKFAST 03/06/21   Dutch Quint B, FNP  ondansetron (ZOFRAN-ODT) 4 MG disintegrating tablet Take 1 tablet (4 mg total) by mouth every 8 (eight) hours as needed for nausea or vomiting. 08/08/20   Cuthriell, Charline Bills, PA-C  pantoprazole (PROTONIX) 40 MG tablet Take 1 tablet (40 mg total) by mouth daily. 12/05/19   Leone Haven, MD  pravastatin (PRAVACHOL) 20 MG tablet TAKE 1 TABLET(20 MG) BY MOUTH DAILY 03/06/21   Dutch Quint B, FNP  sildenafil (REVATIO) 20 MG tablet Take 1-3 tablets (20-60 mg total) by mouth daily as needed (erectile dysfunction). Patient not taking: Reported on 08/22/2020 12/05/19   Leone Haven, MD  vitamin B-12 (CYANOCOBALAMIN) 1000 MCG tablet Take 1 tablet (1,000 mcg total) by mouth daily. 10/27/19   McLean-Scocuzza, Olivia Mackie  N, MD    Family History Family History  Problem Relation Age of Onset   Arthritis Other        grandparent   Hypertension Other        parent and grandparent   Diabetes Mother     Social History Social History   Tobacco Use   Smoking status: Former   Smokeless tobacco: Never  Scientific laboratory technician Use: Never used  Substance Use Topics   Alcohol use: Yes    Alcohol/week: 5.0 standard drinks of alcohol    Types: 5 Standard drinks or equivalent per week   Drug use: No     Allergies   Patient has no known allergies.   Review of Systems Review  of Systems  Constitutional:  Positive for chills, fatigue and fever.  HENT:  Positive for congestion. Negative for ear pain and sore throat.   Respiratory:  Positive for cough. Negative for shortness of breath.   Cardiovascular:  Negative for chest pain and palpitations.  Gastrointestinal:  Negative for abdominal pain, diarrhea and vomiting.  Musculoskeletal:  Negative for arthralgias.  Skin:  Negative for color change and rash.  All other systems reviewed and are negative.    Physical Exam Triage Vital Signs ED Triage Vitals  Enc Vitals Group     BP 03/03/22 1454 138/86     Pulse Rate 03/03/22 1445 (!) 105     Resp 03/03/22 1445 18     Temp 03/03/22 1445 (!) 100.9 F (38.3 C)     Temp src --      SpO2 03/03/22 1445 94 %     Weight --      Height --      Head Circumference --      Peak Flow --      Pain Score 03/03/22 1446 8     Pain Loc --      Pain Edu? --      Excl. in [place]? --    No data found.  Updated Vital Signs BP 138/86   Pulse (!) 105   Temp (!) 100.9 F (38.3 C)   Resp 18   SpO2 94%   Visual Acuity Right Eye Distance:   Left Eye Distance:   Bilateral Distance:    Right Eye Near:   Left Eye Near:    Bilateral Near:     Physical Exam Vitals and nursing note reviewed.  Constitutional:      General: He is not in acute distress.    Appearance: Normal appearance. He is well-developed. He is not ill-appearing.  HENT:     Right Ear: Tympanic membrane normal.     Left Ear: Tympanic membrane normal.     Nose: Rhinorrhea present.     Mouth/Throat:     Mouth: Mucous membranes are moist.     Pharynx: Oropharynx is clear.  Cardiovascular:     Rate and Rhythm: Normal rate and regular rhythm.     Heart sounds: Normal heart sounds.  Pulmonary:     Effort: Pulmonary effort is normal. No respiratory distress.     Breath sounds: Normal breath sounds. No wheezing, rhonchi or rales.  Musculoskeletal:     Cervical back: Neck supple.  Skin:    General: Skin is  warm and dry.  Neurological:     Mental Status: He is alert.  Psychiatric:        Mood and Affect: Mood normal.        Behavior: Behavior  normal.      UC Treatments / Results  Labs (all labs ordered are listed, but only abnormal results are displayed) Labs Reviewed  SARS CORONAVIRUS 2 (TAT 6-24 HRS)    EKG   Radiology No results found.  Procedures Procedures (including critical care time)  Medications Ordered in UC Medications  ibuprofen (ADVIL) tablet 400 mg (400 mg Oral Given 03/03/22 1455)    Initial Impression / Assessment and Plan / UC Course  I have reviewed the triage vital signs and the nursing notes.  Pertinent labs & imaging results that were available during my care of the patient were reviewed by me and considered in my medical decision making (see chart for details).    Viral illness.  COVID pending.  Discussed symptomatic treatment including Tylenol or ibuprofen, rest, hydration.  Education provided on viral illness.  Instructed patient to follow up with his PCP if his symptoms are not improving.  He agrees to plan of care.   Final Clinical Impressions(s) / UC Diagnoses   Final diagnoses:  Viral illness     Discharge Instructions      Your COVID test is pending.    Take Tylenol as needed for fever or discomfort.  Rest and keep yourself hydrated.    Follow-up with your primary care provider if your symptoms are not improving.         ED Prescriptions   None    PDMP not reviewed this encounter.   Sharion Balloon, NP 03/03/22 [phone]

## 2022-03-04 LAB — SARS CORONAVIRUS 2 (TAT 6-24 HRS): SARS Coronavirus 2: NEGATIVE

## 2023-06-23 ENCOUNTER — Ambulatory Visit
Admission: EM | Admit: 2023-06-23 | Discharge: 2023-06-23 | Disposition: A | Discharge status: Home / Self Care | Attending: Emergency Medicine | Admitting: Emergency Medicine

## 2023-06-23 ENCOUNTER — Ambulatory Visit (INDEPENDENT_AMBULATORY_CARE_PROVIDER_SITE_OTHER)

## 2023-06-23 DIAGNOSIS — M79675 Pain in left toe(s): Secondary | ICD-10-CM | POA: Diagnosis not present

## 2023-06-23 NOTE — Discharge Instructions (Signed)
 Take Tylenol  or ibuprofen  as directed.  Rest and elevate your foot.  Apply ice packs 2-3 times a day as directed.  Buddy tape your toes and wear the postop shoe as directed.  Follow up with an orthopedist such as the one listed below.

## 2023-06-23 NOTE — ED Triage Notes (Signed)
 Patient to Urgent Care with complaints of left sided, middle toe pain. Reports he stood up and jammed his toe into the concrete.   Incident occurred last night.

## 2023-06-23 NOTE — ED Provider Notes (Signed)
 Ryan Wolfe    CSN: [id number] Arrival date & time: 06/23/23  1334      History   Chief Complaint Chief Complaint  Patient presents with   Foot Injury    HPI Ryan Wolfe is a 43 y.o. male.  Patient presents with left third toe pain, swelling, and bruising after he accidentally jammed his toe into concrete last night.  No OTC medications today.  No open wounds, numbness, weakness.  The history is provided by the patient and medical records.    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   Dyspnea on exertion 11/11/2018   Chronic pain of left knee 11/11/2018   Constipation 11/11/2018   Anxiety 11/11/2018   Fatigue 10/14/2017   Abdominal fullness 10/14/2017   GERD (gastroesophageal reflux disease) 12/24/2016   Neck pain 11/08/2014   Type 2 diabetes mellitus without complications ([facility]) 11/08/2014   Chest pain 10/27/2014   Abdominal pain 10/27/2014   Headache 10/27/2014   Low back pain 10/27/2014    Past Surgical History:  Procedure Laterality Date   TONSILLECTOMY AND ADENOIDECTOMY     UPPER GI ENDOSCOPY         Home Medications    Prior to Admission medications   Medication Sig Start Date End Date Taking? Authorizing Provider  blood glucose meter kit and supplies KIT Dispense based on patient and insurance preference. Use once daily fasting. Dx Code E11.9. 08/22/20   Kent Pear, MD  Cholecalciferol  1.25 MG (50000 UT) capsule Take 1 capsule (50,000 Units total) by mouth once a week. X 6 months then D3 4000 IU daily over the counter Patient not  taking: Reported on 08/22/2020 10/27/19   McLean-Scocuzza, Karon Packer, MD  ibuprofen  (ADVIL ) 600 MG tablet Take 1 tablet (600 mg total) by mouth every 6 (six) hours as needed. Patient not taking: Reported on 06/23/2023 05/10/20   Wellington Half, NP  meloxicam  (MOBIC ) 15 MG tablet Take 1 tablet (15 mg total) by mouth daily. Patient not taking: Reported on 06/23/2023 08/08/20   Cuthriell, Jonathan D, PA-C  metFORMIN  (GLUCOPHAGE -XR) 500 MG 24 hr tablet TAKE 1 TABLET(500 MG) BY MOUTH DAILY WITH BREAKFAST 03/06/21   Webb, Padonda B, FNP  omeprazole  (PRILOSEC) 20 MG capsule Take by mouth. 01/31/22   [provider]  ondansetron  (ZOFRAN -ODT) 4 MG disintegrating tablet Take 1 tablet (4 mg total) by mouth every 8 (eight) hours as needed for nausea or vomiting. 08/08/20   Cuthriell, Ardath Bears, PA-C  pantoprazole  (PROTONIX ) 40 MG tablet Take 1 tablet (40 mg total) by mouth daily. 12/05/19   Kent Pear, MD  pravastatin  (PRAVACHOL ) 20 MG tablet TAKE 1 TABLET(20 MG) BY MOUTH DAILY 03/06/21   Webb, Padonda B, FNP  sildenafil  (REVATIO ) 20 MG tablet Take 1-3 tablets (20-60 mg total) by mouth daily as needed (erectile dysfunction). Patient not taking: Reported on 08/22/2020 12/05/19   Kent Pear, MD  vitamin B-12 (CYANOCOBALAMIN) 1000 MCG tablet Take 1 tablet (1,000 mcg total) by mouth daily. 10/27/19   McLean-Scocuzza, Karon Packer, MD  Family History Family History  Problem Relation Age of Onset   Arthritis Other        grandparent   Hypertension Other        parent and grandparent   Diabetes Mother     Social History Social History   Tobacco Use   Smoking status: Former   Smokeless tobacco: Never  Vaping Use   Vaping status: Never Used  Substance Use Topics   Alcohol use: Yes    Alcohol/week: 5.0 standard drinks of alcohol    Types: 5 Standard drinks or equivalent per week   Drug use: No     Allergies   Patient has no known allergies.   Review of Systems Review of Systems   Constitutional:  Negative for chills and fever.  Musculoskeletal:  Positive for arthralgias and joint swelling. Negative for gait problem.  Skin:  Positive for color change. Negative for wound.  Neurological:  Negative for weakness and numbness.     Physical Exam Triage Vital Signs ED Triage Vitals [06/23/23 1349]  Encounter Vitals Group     BP 139/89     Systolic BP Percentile      Diastolic BP Percentile      Pulse Rate 70     Resp 18     Temp 97.8 F (36.6 C)     Temp src      SpO2 97 %     Weight      Height      Head Circumference      Peak Flow      Pain Score 8     Pain Loc      Pain Education      Exclude from Growth Chart    No data found.  Updated Vital Signs BP 139/89   Pulse 70   Temp 97.8 F (36.6 C)   Resp 18   SpO2 97%   Visual Acuity Right Eye Distance:   Left Eye Distance:   Bilateral Distance:    Right Eye Near:   Left Eye Near:    Bilateral Near:     Physical Exam Constitutional:      General: He is not in acute distress. HENT:     Mouth/Throat:     Mouth: Mucous membranes are moist.  Cardiovascular:     Rate and Rhythm: Normal rate and regular rhythm.  Pulmonary:     Effort: Pulmonary effort is normal. No respiratory distress.  Musculoskeletal:        General: Swelling and tenderness present. No deformity. Normal range of motion.  Skin:    General: Skin is warm and dry.     Capillary Refill: Capillary refill takes less than 2 seconds.     Findings: Bruising present. No lesion.  Neurological:     General: No focal deficit present.     Mental Status: He is alert.     Sensory: No sensory deficit.     Motor: No weakness.     Gait: Gait normal.      UC Treatments / Results  Labs (all labs ordered are listed, but only abnormal results are displayed) Labs Reviewed - No data to display  EKG   Radiology DG Foot Complete Left Result Date: 06/23/2023 CLINICAL DATA:  Jammed toe, pain EXAM: LEFT FOOT - COMPLETE 3+ VIEW  COMPARISON:  None Available. FINDINGS: There is no evidence of fracture or dislocation. Small calcaneal spurs. There is no evidence of arthropathy or other focal bone abnormality. Soft tissues  are unremarkable. IMPRESSION: Negative. Electronically Signed   By: Nicoletta Barrier M.D.   On: 06/23/2023 16:43    Procedures Procedures (including critical care time)  Medications Ordered in UC Medications - No data to display  Initial Impression / Assessment and Plan / UC Course  I have reviewed the triage vital signs and the nursing notes.  Pertinent labs & imaging results that were available during my care of the patient were reviewed by me and considered in my medical decision making (see chart for details).    Left 3rd toe pain. Xray negative.  Treating with buddy taping of toes, postop shoe, Tylenol  or ibuprofen , rest, elevation, ice packs.  Instructed patient to follow-up with an orthopedist.  Contact information for on-call Ortho provided.  Education provided on foot pain.  Work note provided.  He agrees to plan of care.  Final Clinical Impressions(s) / UC Diagnoses   Final diagnoses:  Toe pain, left     Discharge Instructions      Take Tylenol  or ibuprofen  as directed.  Rest and elevate your foot.  Apply ice packs 2-3 times a day as directed.  Buddy tape your toes and wear the postop shoe as directed.  Follow up with an orthopedist such as the one listed below.    ED Prescriptions   None    I have reviewed the PDMP during this encounter.   Wellington Half, NP 06/23/23 [phone]

## 2023-06-25 ENCOUNTER — Ambulatory Visit (HOSPITAL_COMMUNITY): Payer: Self-pay

## 2023-12-15 ENCOUNTER — Encounter: Payer: Self-pay | Admitting: General Practice

## 2023-12-15 ENCOUNTER — Ambulatory Visit: Admitting: General Practice

## 2023-12-15 VITALS — BP 122/80 | HR 62 | Temp 98.3°F | Ht 65.25 in | Wt 191.0 lb

## 2023-12-15 DIAGNOSIS — E291 Testicular hypofunction: Secondary | ICD-10-CM

## 2023-12-15 DIAGNOSIS — E538 Deficiency of other specified B group vitamins: Secondary | ICD-10-CM | POA: Diagnosis not present

## 2023-12-15 DIAGNOSIS — E119 Type 2 diabetes mellitus without complications: Secondary | ICD-10-CM | POA: Diagnosis not present

## 2023-12-15 DIAGNOSIS — Z7984 Long term (current) use of oral hypoglycemic drugs: Secondary | ICD-10-CM | POA: Diagnosis not present

## 2023-12-15 DIAGNOSIS — K219 Gastro-esophageal reflux disease without esophagitis: Secondary | ICD-10-CM

## 2023-12-15 DIAGNOSIS — Z135 Encounter for screening for eye and ear disorders: Secondary | ICD-10-CM

## 2023-12-15 DIAGNOSIS — E781 Pure hyperglyceridemia: Secondary | ICD-10-CM | POA: Diagnosis not present

## 2023-12-15 DIAGNOSIS — Z7689 Persons encountering health services in other specified circumstances: Secondary | ICD-10-CM | POA: Diagnosis not present

## 2023-12-15 DIAGNOSIS — E66811 Obesity, class 1: Secondary | ICD-10-CM | POA: Diagnosis not present

## 2023-12-15 DIAGNOSIS — E559 Vitamin D deficiency, unspecified: Secondary | ICD-10-CM | POA: Diagnosis not present

## 2023-12-15 DIAGNOSIS — E6609 Other obesity due to excess calories: Secondary | ICD-10-CM | POA: Diagnosis not present

## 2023-12-15 DIAGNOSIS — Z6831 Body mass index (BMI) 31.0-31.9, adult: Secondary | ICD-10-CM

## 2023-12-15 LAB — COMPREHENSIVE METABOLIC PANEL WITH GFR
ALT: 23 U/L (ref 0–53)
AST: 16 U/L (ref 0–37)
Albumin: 4.3 g/dL (ref 3.5–5.2)
Alkaline Phosphatase: 35 U/L — ABNORMAL LOW (ref 39–117)
BUN: 11 mg/dL (ref 6–23)
CO2: 31 meq/L (ref 19–32)
Calcium: 8.5 mg/dL (ref 8.4–10.5)
Chloride: 105 meq/L (ref 96–112)
Creatinine, Ser: 0.66 mg/dL (ref 0.40–1.50)
GFR: 113.69 mL/min (ref >60.00)
Glucose, Bld: 112 mg/dL — ABNORMAL HIGH (ref 70–99)
Potassium: 3.8 meq/L (ref 3.5–5.1)
Sodium: 142 meq/L (ref 135–145)
Total Bilirubin: 0.3 mg/dL (ref 0.2–1.2)
Total Protein: 6.6 g/dL (ref 6.0–8.3)

## 2023-12-15 LAB — LIPID PANEL
Cholesterol: 133 mg/dL (ref 0–200)
HDL: 34 mg/dL — ABNORMAL LOW (ref >39.00)
LDL Cholesterol: 77 mg/dL (ref 0–99)
NonHDL: 98.69
Total CHOL/HDL Ratio: 4
Triglycerides: 106 mg/dL (ref 0.0–149.0)
VLDL: 21.2 mg/dL (ref 0.0–40.0)

## 2023-12-15 LAB — CBC
HCT: 36.1 % — ABNORMAL LOW (ref 39.0–52.0)
Hemoglobin: 11.9 g/dL — ABNORMAL LOW (ref 13.0–17.0)
MCHC: 33 g/dL (ref 30.0–36.0)
MCV: 81.1 fl (ref 78.0–100.0)
Platelets: 224 K/uL (ref 150.0–400.0)
RBC: 4.45 Mil/uL (ref 4.22–5.81)
RDW: 13.9 % (ref 11.5–15.5)
WBC: 4.3 K/uL (ref 4.0–10.5)

## 2023-12-15 LAB — VITAMIN B12: Vitamin B-12: 259 pg/mL (ref 211–911)

## 2023-12-15 LAB — TSH: TSH: 1.17 u[IU]/mL (ref 0.35–5.50)

## 2023-12-15 LAB — TESTOSTERONE: Testosterone: 149.65 ng/dL — ABNORMAL LOW (ref 300.00–890.00)

## 2023-12-15 LAB — MICROALBUMIN / CREATININE URINE RATIO
Creatinine,U: 179 mg/dL
Microalb Creat Ratio: 6.1 mg/g (ref 0.0–30.0)
Microalb, Ur: 1.1 mg/dL (ref 0.0–1.9)

## 2023-12-15 LAB — HEMOGLOBIN A1C: Hgb A1c MFr Bld: 6.6 % — ABNORMAL HIGH (ref 4.6–6.5)

## 2023-12-15 LAB — VITAMIN D 25 HYDROXY (VIT D DEFICIENCY, FRACTURES): VITD: 11.59 ng/mL — ABNORMAL LOW (ref 30.00–100.00)

## 2023-12-15 MED ORDER — PANTOPRAZOLE SODIUM 40 MG PO TBEC: 40.0000 mg | DELAYED_RELEASE_TABLET | Freq: Every day | ORAL | 3 refills | Status: AC

## 2023-12-15 NOTE — Patient Instructions (Addendum)
 Stop by the lab prior to leaving today. I will notify you of your results once received.   Schedule diabetic eye exam.  You will either be contacted via phone regarding your referral to diabetic eye exam, or you may receive a letter on your MyChart portal from our referral team with instructions for scheduling an appointment. Please let us  know if you have not been contacted by anyone within two weeks.   Restart Pantoprazole  40 mg once daily. Avoid trigger foods.   Follow up in 3 months for physical.   It was a pleasure to see you today!

## 2023-12-15 NOTE — Progress Notes (Signed)
 New Patient Office Visit  Subjective    Patient ID: Ryan Wolfe, male    DOB: 01-23-1981  Age: 43 y.o. MRN: [id number]  CC:  Chief Complaint  Patient presents with   New Patient (Initial Visit)    Previous [place] [facility] patient of Dr. Lowell    HPI Ryan Wolfe is a 43 y.o. male presents to establish care, for complete physical and follow up of chronic conditions. Previous PCP/physical/labs: [facility]. 11/14/2022  Discussed the use of AI scribe software for clinical note transcription with the patient, who gave verbal consent to proceed.  History of Present Illness Ryan Wolfe is a 43 year old male with type 2 diabetes who presents for a transfer of care.  He was diagnosed with type 2 diabetes and has been on metformin  500 mg once daily. He has been out of metformin  for about four to five days and has been taking it every other day to stretch the medication. Metformin  sometimes causes diarrhea and dry mouth. His last A1c was checked in October. He monitors his blood sugar at home, with fasting levels around 106 mg/dL and postprandial levels up to 135 mg/dL. He has not had a diabetic eye exam since 2022.  He has a history of high triglycerides, with the last recorded level being 220 mg/dL in May 7976.  He has been taking omeprazole  for GERD since his early twenties, previously using over-the-counter medications like Prevacid. The current dosage feels weaker, and certain foods can exacerbate his symptoms.  He has a history of vitamin D  and B12 deficiencies, with persistently low levels despite supplementation.   He reports low energy levels and has previously received testosterone shots at another facility due to low testosterone levels.  No history of high blood pressure.  He exercises once a week and works second shift, which affects his ability to exercise more frequently.   He drinks alcohol on weekends, sometimes consuming four to five  drinks.   No chest pain, shortness of breath, or difficulty breathing. Occasional diarrhea with metformin  use.    Outpatient Encounter Medications as of 12/15/2023  Medication Sig   metFORMIN  (GLUCOPHAGE -XR) 500 MG 24 hr tablet TAKE 1 TABLET(500 MG) BY MOUTH DAILY WITH BREAKFAST   [DISCONTINUED] omeprazole  (PRILOSEC) 20 MG capsule Take by mouth.   blood glucose meter kit and supplies KIT Dispense based on patient and insurance preference. Use once daily fasting. Dx Code E11.9.   pantoprazole  (PROTONIX ) 40 MG tablet Take 1 tablet (40 mg total) by mouth daily.   [DISCONTINUED] Cholecalciferol  1.25 MG (50000 UT) capsule Take 1 capsule (50,000 Units total) by mouth once a week. X 6 months then D3 4000 IU daily over the counter (Patient not taking: Reported on 08/22/2020)   [DISCONTINUED] ibuprofen  (ADVIL ) 600 MG tablet Take 1 tablet (600 mg total) by mouth every 6 (six) hours as needed. (Patient not taking: Reported on 06/23/2023)   [DISCONTINUED] meloxicam  (MOBIC ) 15 MG tablet Take 1 tablet (15 mg total) by mouth daily. (Patient not taking: Reported on 06/23/2023)   [DISCONTINUED] ondansetron  (ZOFRAN -ODT) 4 MG disintegrating tablet Take 1 tablet (4 mg total) by mouth every 8 (eight) hours as needed for nausea or vomiting.   [DISCONTINUED] pantoprazole  (PROTONIX ) 40 MG tablet Take 1 tablet (40 mg total) by mouth daily.   [DISCONTINUED] pravastatin  (PRAVACHOL ) 20 MG tablet TAKE 1 TABLET(20 MG) BY MOUTH DAILY   [DISCONTINUED] sildenafil  (REVATIO ) 20 MG tablet Take 1-3 tablets (20-60 mg total) by mouth daily as needed (  erectile dysfunction). (Patient not taking: Reported on 08/22/2020)   [DISCONTINUED] vitamin B-12 (CYANOCOBALAMIN) 1000 MCG tablet Take 1 tablet (1,000 mcg total) by mouth daily.   No facility-administered encounter medications on file as of 12/15/2023.    Past Medical History:  Diagnosis Date   Anxiety    Arthritis    Chicken pox    Diabetes mellitus without complication (HCC)     GERD (gastroesophageal reflux disease)    Headache     Past Surgical History:  Procedure Laterality Date   TONSILLECTOMY AND ADENOIDECTOMY     UPPER GI ENDOSCOPY      Family History  Problem Relation Age of Onset   Arthritis Other        grandparent   Hypertension Other        parent and grandparent   Diabetes Mother     Social History   Socioeconomic History   Marital status: Married    Spouse name: Not on file   Number of children: Not on file   Years of education: Not on file   Highest education level: Not on file  Occupational History   Not on file  Tobacco Use   Smoking status: Former    Current packs/day: 0.00    Average packs/day: 1.5 packs/day for 15.0 years (22.5 ttl pk-yrs)    Types: Cigarettes    Quit date: 01/28/2010    Years since quitting: 13.8   Smokeless tobacco: Never  Vaping Use   Vaping status: Never Used  Substance and Sexual Activity   Alcohol use: Yes    Alcohol/week: 5.0 standard drinks of alcohol    Types: 5 Standard drinks or equivalent per week   Drug use: No   Sexual activity: Yes    Birth control/protection: None  Other Topics Concern   Not on file  Social History Narrative   Not on file   Social Drivers of Health   Financial Resource Strain: Patient Declined (12/14/2023)   Overall Financial Resource Strain (CARDIA)    Difficulty of Paying Living Expenses: Patient declined  Food Insecurity: Patient Declined (12/14/2023)   Hunger Vital Sign    Worried About Running Out of Food in the Last Year: Patient declined    Ran Out of Food in the Last Year: Patient declined  Transportation Needs: No Transportation Needs (12/14/2023)   PRAPARE - Administrator, Civil Service (Medical): No    Lack of Transportation (Non-Medical): No  Physical Activity: Insufficiently Active (12/14/2023)   Exercise Vital Sign    Days of Exercise per Week: 2 days    Minutes of Exercise per Session: 40 min  Stress: No Stress Concern Present  (12/14/2023)   [employer] of Occupational Health - Occupational Stress Questionnaire    Feeling of Stress: Only a little  Social Connections: Moderately Integrated (12/14/2023)   Social Connection and Isolation Panel    Frequency of Communication with Friends and Family: More than three times a week    Frequency of Social Gatherings with Friends and Family: Twice a week    Attends Religious Services: More than 4 times per year    Active Member of [employer] or Organizations: No    Attends Engineer, Structural: Not on file    Marital Status: Married  Catering Manager Violence: Not on file    Review of Systems  Constitutional:  Negative for chills and fever.  Respiratory:  Negative for shortness of breath.   Cardiovascular:  Negative for chest pain.  Gastrointestinal:  Negative for abdominal pain, constipation, diarrhea, heartburn, nausea and vomiting.  Genitourinary:  Negative for dysuria, frequency and urgency.  Neurological:  Negative for dizziness and headaches.  Endo/Heme/Allergies:  Negative for polydipsia.  Psychiatric/Behavioral:  Negative for depression and suicidal ideas. The patient is not nervous/anxious.         Objective    BP 122/80   Pulse 62   Temp 98.3 F (36.8 C) (Temporal)   Ht 5' 5.25 (1.657 m)   Wt 191 lb (86.6 kg)   SpO2 96%   BMI 31.54 kg/m   Physical Exam Vitals and nursing note reviewed.  Constitutional:      Appearance: Normal appearance.  Cardiovascular:     Rate and Rhythm: Normal rate and regular rhythm.     Pulses: Normal pulses.     Heart sounds: Normal heart sounds.  Pulmonary:     Effort: Pulmonary effort is normal.     Breath sounds: Normal breath sounds.  Neurological:     Mental Status: He is alert and oriented to person, place, and time.  Psychiatric:        Mood and Affect: Mood normal.        Behavior: Behavior normal.        Thought Content: Thought content normal.        Judgment: Judgment normal.          Assessment & Plan:  Type 2 diabetes mellitus without complication, without long-term current use of insulin (HCC) -     CBC -     Comprehensive metabolic panel with GFR -     Hemoglobin A1c -     Microalbumin / creatinine urine ratio  Establishing care with new doctor, encounter for  High triglycerides -     Lipid panel -     TSH  Class 1 obesity due to excess calories with serious comorbidity and body mass index (BMI) of 31.0 to 31.9 in adult -     Lipid panel -     TSH  Gastroesophageal reflux disease, unspecified whether esophagitis present -     Pantoprazole  Sodium; Take 1 tablet (40 mg total) by mouth daily.  Dispense: 30 tablet; Refill: 3  Vitamin D  deficiency -     VITAMIN D  25 Hydroxy (Vit-D Deficiency, Fractures)  B12 deficiency -     Vitamin B12  Hypogonadism in male -     Testosterone  Diabetic retinopathy screening -     Ambulatory referral to Ophthalmology    Assessment and Plan Assessment & Plan Type 2 Diabetes Mellitus Long-standing type 2 diabetes mellitus managed with metformin .  - Ordered A1c test to assess current glycemic control. - Will prescribe Rybelsus once daily pending A1c results. - Educated on monitoring blood sugars at home. - Advised to schedule diabetic eye exam. Referral placed.  - Foot exam at next visit.  - Declines pneumonia vaccine.  Gastroesophageal reflux disease (GERD) Long-standing GERD managed with omeprazole . Previously on pantoprazole , which was more effective. - Prescribed pantoprazole  40 mg once daily. - Advised to avoid trigger foods such as fried, greasy, spicy, and acidic foods.  Pure hyperglyceridemia (hypertriglyceridemia) Previous triglyceride level of 220 mg/dL. Currently not on statin therapy as cholesterol levels were good when on medication. - Ordered lipid panel to assess current cholesterol and triglyceride levels.  Vitamin D  deficiency Previous supplementation. Currently not on vitamin D   supplements. - Ordered vitamin D  level to assess current status.  Deficiency of B group vitamins Previous supplementation. Currently not on  B vitamin supplements. - Ordered B12 level to assess current status.  Testicular hypofunction Previous testosterone replacement therapy. Reports low energy levels. Interested in re-evaluation of testosterone levels. - Ordered testosterone level to assess current status. - Will refer to urologist for potential testosterone replacement therapy if levels are low.    Return in about 3 months (around 03/16/2024) for physical .   Carrol Aurora, NP

## 2023-12-16 ENCOUNTER — Ambulatory Visit: Payer: Self-pay | Admitting: General Practice

## 2023-12-16 DIAGNOSIS — E119 Type 2 diabetes mellitus without complications: Secondary | ICD-10-CM

## 2023-12-16 DIAGNOSIS — E559 Vitamin D deficiency, unspecified: Secondary | ICD-10-CM

## 2023-12-16 MED ORDER — RYBELSUS 3 MG PO TABS: 3.0000 mg | ORAL_TABLET | Freq: Every day | ORAL | 2 refills | Status: AC

## 2023-12-16 MED ORDER — VITAMIN D (ERGOCALCIFEROL) 1.25 MG (50000 UNIT) PO CAPS: 50000.0000 [IU] | ORAL_CAPSULE | ORAL | 0 refills | Status: AC
